# Patient Record
Sex: Male | Born: 1960 | Race: White | Hispanic: No | Marital: Married | Smoking: Never smoker
Health system: Southern US, Community
[De-identification: ages and names within clinical notes are randomized; demographics above are authoritative.]

## PROBLEM LIST (undated history)

## (undated) DIAGNOSIS — S46211A Strain of muscle, fascia and tendon of other parts of biceps, right arm, initial encounter: Secondary | ICD-10-CM

## (undated) DIAGNOSIS — K222 Esophageal obstruction: Secondary | ICD-10-CM

## (undated) DIAGNOSIS — R112 Nausea with vomiting, unspecified: Secondary | ICD-10-CM

## (undated) DIAGNOSIS — E785 Hyperlipidemia, unspecified: Secondary | ICD-10-CM

## (undated) DIAGNOSIS — Z9889 Other specified postprocedural states: Secondary | ICD-10-CM

## (undated) DIAGNOSIS — D126 Benign neoplasm of colon, unspecified: Secondary | ICD-10-CM

## (undated) DIAGNOSIS — K219 Gastro-esophageal reflux disease without esophagitis: Secondary | ICD-10-CM

## (undated) DIAGNOSIS — N200 Calculus of kidney: Secondary | ICD-10-CM

## (undated) HISTORY — PX: ROTATOR CUFF REPAIR: SHX139

## (undated) HISTORY — PX: HERNIA REPAIR: SHX51

## (undated) HISTORY — DX: Esophageal obstruction: K22.2

## (undated) HISTORY — DX: Gastro-esophageal reflux disease without esophagitis: K21.9

## (undated) HISTORY — DX: Hyperlipidemia, unspecified: E78.5

## (undated) HISTORY — DX: Calculus of kidney: N20.0

## (undated) HISTORY — DX: Benign neoplasm of colon, unspecified: D12.6

## (undated) HISTORY — PX: BICEPS TENDON REPAIR: SHX566

---

## 1998-07-14 ENCOUNTER — Ambulatory Visit (HOSPITAL_COMMUNITY): Admission: RE | Admit: 1998-07-14 | Discharge: 1998-07-14 | Payer: Self-pay | Admitting: *Deleted

## 2004-02-27 ENCOUNTER — Ambulatory Visit (HOSPITAL_COMMUNITY): Admission: RE | Admit: 2004-02-27 | Discharge: 2004-02-27 | Payer: Self-pay | Admitting: Internal Medicine

## 2004-10-20 ENCOUNTER — Encounter: Admission: RE | Admit: 2004-10-20 | Discharge: 2004-10-20 | Payer: Self-pay | Admitting: General Surgery

## 2005-09-29 ENCOUNTER — Encounter: Admission: RE | Admit: 2005-09-29 | Discharge: 2005-09-29 | Payer: Self-pay | Admitting: General Surgery

## 2007-02-26 ENCOUNTER — Ambulatory Visit: Payer: Self-pay | Admitting: Cardiology

## 2007-03-28 ENCOUNTER — Ambulatory Visit: Payer: Self-pay | Admitting: Cardiology

## 2008-09-05 DIAGNOSIS — D126 Benign neoplasm of colon, unspecified: Secondary | ICD-10-CM

## 2008-09-05 HISTORY — DX: Benign neoplasm of colon, unspecified: D12.6

## 2009-01-26 ENCOUNTER — Encounter (INDEPENDENT_AMBULATORY_CARE_PROVIDER_SITE_OTHER): Payer: Self-pay | Admitting: *Deleted

## 2009-01-27 ENCOUNTER — Encounter (INDEPENDENT_AMBULATORY_CARE_PROVIDER_SITE_OTHER): Payer: Self-pay | Admitting: *Deleted

## 2009-04-05 HISTORY — PX: ESOPHAGOGASTRODUODENOSCOPY: SHX1529

## 2009-04-05 HISTORY — PX: COLONOSCOPY: SHX174

## 2009-04-16 ENCOUNTER — Ambulatory Visit: Payer: Self-pay | Admitting: Internal Medicine

## 2009-04-17 ENCOUNTER — Encounter: Payer: Self-pay | Admitting: Internal Medicine

## 2009-04-22 ENCOUNTER — Ambulatory Visit: Payer: Self-pay | Admitting: Internal Medicine

## 2009-04-22 ENCOUNTER — Encounter: Payer: Self-pay | Admitting: Internal Medicine

## 2009-04-22 ENCOUNTER — Ambulatory Visit (HOSPITAL_COMMUNITY): Admission: RE | Admit: 2009-04-22 | Discharge: 2009-04-22 | Payer: Self-pay | Admitting: Internal Medicine

## 2009-04-24 ENCOUNTER — Encounter: Payer: Self-pay | Admitting: Internal Medicine

## 2009-04-28 ENCOUNTER — Encounter: Payer: Self-pay | Admitting: Internal Medicine

## 2010-04-05 HISTORY — PX: APPENDECTOMY: SHX54

## 2010-10-07 NOTE — Letter (Signed)
Summary: tcs report/2005/dr gessner  tcs report/2005/dr gessner   Imported By: Diana Eves 04/24/2009 15:26:09  _____________________________________________________________________  External Attachment:    Type:   Image     Comment:   External Document

## 2010-10-07 NOTE — Letter (Signed)
Summary: Recall Colonoscopy Letter  [facility]  [address] [place], [place] [address]   Phone: [phone]  Fax: [phone]      Jan 27, 2009 MRN: [id number]   [facility] [address] [place], [place]  [address]   Dear Ryan Jennings,   According to your medical record, it is time for you to schedule a Colonoscopy. The American Cancer Society recommends this procedure as a method to detect early colon cancer. Patients with a family history of colon cancer, or a personal history of colon polyps or inflammatory bowel disease are at increased risk.  This letter has beeen generated based on the recommendations made at the time of your procedure. If you feel that in your particular situation this may no longer apply, please contact our office.  Please call our office at [phone] to schedule this appointment or to update your records at your earliest convenience.  Thank you for cooperating with [place] to provide you with the very best care possible.   Sincerely,  Iva Boop, M.D.  [facility] [phone]

## 2010-10-07 NOTE — Letter (Signed)
Summary: REFERRAL/[facility]  REFERRAL/[facility]   Imported By: Diana Eves 04/17/2009 14:19:10  _____________________________________________________________________  External Attachment:    Type:   Image     Comment:   External Document

## 2010-10-07 NOTE — Letter (Signed)
Summary: Generic Letter, Intro to Referring  [facility]  [address]   [place], [place] [address]   Phone: [phone]  Fax: [phone]      Jan 26, 2009             RE: Ryan Jennings   02-08-1961                 [address]                 [place], [place]  [address]  Dear Appt Secretary,   This pt has been scheduled an appt for 04/16/2009 @ 1:30 w/ [place].  LMOM with appt info and for a return call.          Sincerely,    Elinor Parkinson  [facility] Ph: [phone]   Fax: [phone]

## 2010-10-07 NOTE — Letter (Signed)
Summary: [facility]/EGD ORDER  [facility]/EGD ORDER   Imported By: Ave Filter 04/17/2009 13:22:32  _____________________________________________________________________  External Attachment:    Type:   Image     Comment:   External Document

## 2010-10-07 NOTE — Letter (Signed)
Summary: Patient Notice, Colon Biopsy Results  [facility]  [address]   [place], [place] [address]   Phone: [phone]  Fax: [phone]       April 28, 2009   [place] [address] [place], [place]  [address] September 28, 1960    Dear Mr. Peckinpaugh,  I am pleased to inform you that the biopsies taken during your recent colonoscopy did not show any evidence of cancer upon pathologic examination.  Additional information/recommendations:  You should have a repeat colonoscopy examination  in 5 years.  Please call us if you are having persistent problems or have questions about your condition that have not been fully answered at this time.  Sincerely,    R. Roetta Sessions MD  [facility] Ph: [phone]    Fax: [phone]   Appended Document: Patient Notice, Colon Biopsy Results mailed letter to pt  Appended Document: Patient Notice, Colon Biopsy Results Reminder in computer for 5 yrs.  AS

## 2011-01-18 NOTE — Procedures (Signed)
[facility]                              EXERCISE TREADMILL   NAME:Ryan Jennings, Ryan Jennings                      MRN:          [id number]  DATE:03/28/2007                            DOB:          1961/02/19    INDICATIONS:  Evaluate patient with chest pain and cardiovascular risk  factors.   PROCEDURE NOTE:  The patient was exercised using standard Bruce  protocol.  He was able to exercise for 12 minutes 10 seconds.  This was  10 seconds into stage IV.  The test was terminated because he had  achieved his target heart rate.  He achieved 10.1 mets.  An appropriate  blood pressure response, with a maximum of 175/78.  He hit his target  heart rate, with a maximum of 171, which was 98% of predicted.  He had  no ectopy.  He had no ischemic ST-T wave changes.  He had a normal heart  rate recovery.   CONCLUSION:  Negative adequate exercise treadmill test, with an  excellent exercise tolerance.  There are no high-risk features.   PLAN:  Based on the above, no further cardiovascular testing is  suggested.  The patient is in the low-risk category for future  cardiovascular events.  I did give him a prescription for exercise and  specific instructions on this based on this study.     Rollene Rotunda, MD, [facility]  Electronically Signed    JH/MedQ  DD: 03/28/2007  DT: 03/29/2007  Job #: [id number]   cc:   Lindaann Pascal, PA

## 2011-01-18 NOTE — Op Note (Signed)
NAMEDRAY, DENTE               ACCOUNT NO.:  [id number]   MEDICAL RECORD NO.:  [id number]          PATIENT TYPE:  AMB   LOCATION:  DAY                           FACILITY:  [facility]   PHYSICIAN:  R. Roetta Sessions, M.D. DATE OF BIRTH:  [birth year]   DATE OF PROCEDURE:  04/22/2009  DATE OF DISCHARGE:                               OPERATIVE REPORT   PROCEDURE:  EGD with Elease Hashimoto dilation followed by colonoscopy and snare  polypectomy.   INDICATIONS FOR PROCEDURE:  A 50 year old gentleman with recurrent  esophageal dysphagia to solids.  He underwent dilation of Schatzki's  ring back in the '90s and has done well until recently.  He has a  positive family history of colon polyps in a first degree relative at a  young age.  He is here for screening colonoscopy as well.  Risks,  benefits, alternatives and limitations of the procedure have been  discussed with Mr. Fitterer previously and again today at the bedside.  Please see the documentation in the medical record.   PROCEDURE NOTE:  O2 saturation, blood pressure, pulse and respirations  were monitored throughout the entirety of both procedures.  Conscious  sedation for both procedures Versed 6 mg IV, Demerol 100 mg IV in  divided doses, Zofran 4 mg IV as prophylaxis against peri procedure  nausea.  Cetacaine spray for topical pharyngeal anesthesia.  Instrumentation Pentax video chip system.   FINDINGS:  EGD examination, tubular esophagus revealed circumferential  distal esophageal erosions.  He had a couple of linear erosions as well.  The longest being approximately 2 cm superimposed on a Schatzki's ring.  There was no evidence of Barrett's esophagus or neoplasm.  The EG  junction was easily traversed with a scope.  Stomach:  Gastric cavity  was emptied and insufflated well with air.  Thorough examination of  gastric mucosa including retroflexion of proximal  stomach/esophagogastric junction demonstrated a small hiatal hernia and  a  couple of tiny antral erosions otherwise gastric mucosa appeared  entirely normal.  There was no ulcer infiltrating process.  The pylorus  was patent and easily traversed.  Examination of the bulb to the second  portion revealed no abnormalities.  Therapeutic / diagnostic maneuvers  performed:  A 52 French Maloney dilator passed full insertion with ease  and a look back revealed nice rupture of the ring with minimal bleeding  and without apparent complication.  The patient tolerated the procedure  well and was prepared for colonoscopy.  Digital rectal exam revealed no  abnormalities on scout finding, prep was adequate.  Colon:  Colonic  mucosa was surveyed from the rectosigmoid junction through the left  transverse and right colon to the appendiceal office, ileocecal valve  and cecum.  These structures were well seen and photographed for the  record.  From this level the scope was slowly cautiously withdrawn.  All  previous mentioned mucosal surfaces were again seen.  The patient had a  pedunculated angry 8 mm polyp in the mid sigmoid segment which was cold  snared, recovered through the scope.  The remainder of the colonic  mucosa  appeared normal.  The scope was pulled down in the rectum where  thorough examination of the rectal mucosa including retroflex view of  the anal verge demonstrated no abnormalities.  The patient tolerated  both procedures well and was reactive after endoscopy.  Cecal withdrawal  time 7 minutes.   EGD IMPRESSION:  Distal circumferential esophageal erosions consistent  with erosive reflux esophagitis, Schatzki's ring status post dilation  described above, hiatal hernia, a couple of tiny antral erosions of  doubtful clinical significance, otherwise normal stomach, patent pylorus  and normal D1, D2.   COLONOSCOPY IMPRESSION:  Normal rectum, pedunculated sigmoid polyp  status post snare removal and remainder of the colonic mucosa appeared  normal.    RECOMMENDATIONS:  1. Begin Protonix 40 mg orally daily for gastroesophageal reflux      disease.  2. Follow up on pathology.  3. Further recommendations to follow.      Jonathon Bellows, M.D.  Electronically Signed     RMR/MEDQ  D:  04/22/2009  T:  04/22/2009  Job:  [id number]

## 2011-01-18 NOTE — Assessment & Plan Note (Signed)
[facility]                            CARDIOLOGY OFFICE NOTE   JEREMIE, GIANGRANDE                      MRN:          [id number]  DATE:02/26/2007                            DOB:          01-29-1961    REASON FOR PRESENTATION:  Evaluate patient with chest pain and  palpitations.   HISTORY OF PRESENT ILLNESS:  The patient is a very pleasant 50 year old  gentleman with no prior cardiac history.  He did have a treadmill test  about 3 years ago that was reportedly normal.  This was done in part of  a routine physical.  Recently he had some hear fluttering.  He felt this  while he was walking at lunch one day.  He felt his heart skip some  beats.  It was uncomfortable, like a feeling that he was on a  rollercoaster.  He stopped and then resumed walking and it happened  again.  He has had symptoms a few days later when he was doing some work  in the heat.  He felt very hot and got the fluttering.  However, over  the past 2 weeks he has had none of this.  He has not been walking  outside as much as he had been, though he has been his activities of  daily living and not bringing on any of these symptoms.  He did notice  some mild chest tightness.  This has been a constant discomfort when it  comes on, lasting for perhaps an entire day.  Some mild nagging  discomfort mid sternum.  It has not limited him.  He can not bring it  on.  He feels like he has some gas buildup.  He has no radiation to his  arm or to his neck. It goes away without him even thinking about it.  He  has no shortness of breath.  Denies any PND or orthopnea.  He is not  having any presyncope or syncope.   PAST MEDICAL HISTORY:  Dyslipidemia x2 years.   PAST SURGICAL HISTORY:  1. Bilateral inguinal hernia repairs.  2. Umbilical hernia repair.  3. Rotator cuff surgery.   ALLERGIES:  PENICILLIN.   MEDICATIONS:  Crestor 5 mg daily.   SOCIAL HISTORY:  The patient is a Producer, television/film/video.  He is married and  has 3 children.  He has never smoked cigarettes and does not drink  alcohol.   FAMILY HISTORY:  Noncontributory for early coronary artery disease.   REVIEW OF SYSTEMS:  As stated in the HPI and negative for all other  systems except for seasonal allergies, reflux.   PHYSICAL EXAMINATION:  The patient is well-appearing and in no distress.  Blood pressure 130/80, heart rate 53 and regular, weight 161 pounds,  body mass index 24.  HEENT:  Eyelids unremarkable, pupils equally round and reactive to  light, fundi within normal limits, oral mucosa unremarkable.  NECK:  No jugular venous distention at 45 degrees, carotid upstroke  brisk and symmetrical, no bruits, no thyromegaly.  LYMPHATICS:  No cervical, axillary, or inguinal adenopathy.  LUNGS:  Clear to  auscultation bilaterally.  BACK:  No costovertebral angle tenderness.  CHEST:  Unremarkable.  HEART:  PMI not displaced or sustained, S1 and S2 within normal limits,  no S3, no S4, no clicks, no rubs, no murmurs.  ABDOMEN:  Flat, positive bowel sounds normal in frequency and pitch, no  bruits, no rebound, no guarding, no midline pulsatile mass, no  hepatomegaly, no splenomegaly.  SKIN:  No rashes, no nodules.  EXTREMITIES:  With 2+ pulses throughout, no edema, no cyanosis, no  clubbing.  NEURO:  Oriented to person, place, and time; cranial nerves II-XII  grossly intact, motor grossly intact.   EKG sinus rhythm, rate 60, axis within normal limits, intervals within  normal limits, no acute ST-T wave changes.   ASSESSMENT/PLAN:  1. Palpitations.  The patient was having palpitations but these ended      about 2 weeks ago.  They were either premature atrial contractions      or premature ventricular contractions.  I would not suspect any      significant pathology related to these, therefore, no further      cardiovascular testing is suggested.  If they recur he should avoid      caffeine as a first line and  let me know.  2. Chest discomfort.  The patient's chest discomfort is somewhat      atypical.  He does have dyslipidemia.  He is not exercising as much      as I would hope.  I think the pre-test probability of obstructive      coronary artery disease is low.  However, I would like to do an      exercise treadmill test to rule our obstructive disease, risk      stratifying.  Gave him a prescription for exercise.  3. Followup.  I will see him at the time of his stress test.     Rollene Rotunda, MD, [facility]  Electronically Signed    JH/MedQ  DD: 02/26/2007  DT: 02/26/2007  Job #: [id number]

## 2011-03-28 ENCOUNTER — Other Ambulatory Visit: Payer: Self-pay

## 2011-03-29 MED ORDER — PANTOPRAZOLE SODIUM 40 MG PO TBEC: 40.0000 mg | DELAYED_RELEASE_TABLET | Freq: Every day | ORAL | Status: DC

## 2011-03-29 NOTE — Telephone Encounter (Signed)
Called patient and told him he would need an office visit before he could get his next refill. He said that was fine and would call to make an appointments.

## 2011-03-29 NOTE — Telephone Encounter (Signed)
Patient needs OV prior to further refills.

## 2011-04-06 ENCOUNTER — Other Ambulatory Visit: Payer: Self-pay

## 2011-04-07 ENCOUNTER — Encounter: Payer: Self-pay | Admitting: Internal Medicine

## 2011-04-07 MED ORDER — PANTOPRAZOLE SODIUM 40 MG PO TBEC: 40.0000 mg | DELAYED_RELEASE_TABLET | Freq: Every day | ORAL | Status: DC

## 2011-04-07 NOTE — Telephone Encounter (Signed)
Pt needs OV prior to any further RFs (I gave 2)

## 2011-04-07 NOTE — Telephone Encounter (Signed)
LMOM for pt to call. 

## 2011-04-07 NOTE — Telephone Encounter (Signed)
Pt is aware of OV for 8/31 @ 1030 w/LSL

## 2011-05-06 ENCOUNTER — Encounter: Payer: Self-pay | Admitting: Gastroenterology

## 2011-05-06 ENCOUNTER — Ambulatory Visit (INDEPENDENT_AMBULATORY_CARE_PROVIDER_SITE_OTHER): Payer: 59 | Admitting: Gastroenterology

## 2011-05-06 VITALS — BP 135/72 | HR 67 | Temp 98.0°F | Ht 68.0 in | Wt 163.4 lb

## 2011-05-06 DIAGNOSIS — K219 Gastro-esophageal reflux disease without esophagitis: Secondary | ICD-10-CM | POA: Insufficient documentation

## 2011-05-06 DIAGNOSIS — R1310 Dysphagia, unspecified: Secondary | ICD-10-CM | POA: Insufficient documentation

## 2011-05-06 DIAGNOSIS — R1319 Other dysphagia: Secondary | ICD-10-CM

## 2011-05-06 DIAGNOSIS — R1314 Dysphagia, pharyngoesophageal phase: Secondary | ICD-10-CM

## 2011-05-06 NOTE — Progress Notes (Signed)
Primary Care Physician:  Monica Becton, MD  Primary Gastroenterologist:  Roetta Sessions, MD   Chief Complaint  Patient presents with  . Medication Refill    difficulty swallowing again    HPI:  Ryan Jennings is a 50 y.o. male here for followup of GERD. He complains of recurrent esophageal solid food dysphagia. At times food becomes lodged and sometimes it comes back up. Typical heartburn well controlled on pantoprazole. No vomiting, weight loss, abdominal pain, constipation, diarrhea, melena, rectal bleeding.  Current Outpatient Prescriptions  Medication Sig Dispense Refill  . fish oil-omega-3 fatty acids 1000 MG capsule Take 2 g by mouth daily.        . pantoprazole (PROTONIX) 40 MG tablet Take 1 tablet (40 mg total) by mouth daily.  31 tablet  1  . Red Yeast Rice 600 MG TABS Take by mouth.          Allergies as of 05/06/2011 - Review Complete 05/06/2011  Allergen Reaction Noted  . Penicillins Rash     Past Medical History  Diagnosis Date  . GERD (gastroesophageal reflux disease)   . Nephrolithiasis   . Esophageal ring     s/p dilation twice, last time 2010  . Adenomatous colon polyp 2010  . Hyperlipidemia     Past Surgical History  Procedure Date  . Appendectomy 8/11  . Rotator cuff repair     right  . Hernia repair     x 3  . Biceps tendon repair   . Esophagogastroduodenoscopy 04/2009    distal erosive reflux esophagitis, schatzki ring s/p 56F, hh, couple of antral erosions  . Colonoscopy 04/2009    sigmoid adenomatous polyp, next colonoscopy 04/2014    Family History  Problem Relation Age of Onset  . Lymphoma Mother 12    deceased  . Colon polyps Father     before age of 66  . Stroke Paternal Grandfather 90  . Pancreatic cancer Maternal Grandfather     History   Social History  . Marital Status: Married    Spouse Name: N/A    Number of Children: 3  . Years of Education: N/A   Occupational History  .  [employer]   Social History Main  Topics  . Smoking status: Never Smoker   . Smokeless tobacco: Not on file  . Alcohol Use: No  . Drug Use: No  . Sexually Active: Not on file   Other Topics Concern  . Not on file   Social History Narrative  . No narrative on file      ROS:  General: Negative for anorexia, weight loss, fever, chills, fatigue, weakness. Eyes: Negative for vision changes.  ENT: Negative for hoarseness, nasal congestion. CV: Negative for chest pain, angina, palpitations, dyspnea on exertion, peripheral edema.  Respiratory: Negative for dyspnea at rest, dyspnea on exertion, cough, sputum, wheezing.  GI: See history of present illness. GU:  Negative for dysuria, hematuria, urinary incontinence, urinary frequency, nocturnal urination.  MS: Negative for joint pain, low back pain.  Derm: Negative for rash or itching.  Neuro: Negative for weakness, abnormal sensation, seizure, frequent headaches, memory loss, confusion.  Psych: Negative for anxiety, depression, suicidal ideation, hallucinations.  Endo: Negative for unusual weight change.  Heme: Negative for bruising or bleeding. Allergy: Negative for rash or hives.    Physical Examination:  BP 135/72  Pulse 67  Temp(Src) 98 F (36.7 C) (Temporal)  Ht 5\' 8"  (1.727 m)  Wt 163 lb 6.4 oz (74.118 kg)  BMI 24.84 kg/m2   General: Well-nourished, well-developed in no acute distress.  Head: Normocephalic, atraumatic.   Eyes: Conjunctiva pink, no icterus. Mouth: Oropharyngeal mucosa moist and pink , no lesions erythema or exudate. Neck: Supple without thyromegaly, masses, or lymphadenopathy.  Lungs: Clear to auscultation bilaterally.  Heart: Regular rate and rhythm, no murmurs rubs or gallops.  Abdomen: Bowel sounds are normal, nontender, nondistended, no hepatosplenomegaly or masses, no abdominal bruits or    hernia , no rebound or guarding.   Rectal: Not performed. Extremities: No lower extremity edema. No clubbing or deformities.  Neuro: Alert and  oriented x 4 , grossly normal neurologically.  Skin: Warm and dry, no rash or jaundice.   Psych: Alert and cooperative, normal mood and affect.

## 2011-05-06 NOTE — Assessment & Plan Note (Signed)
Probable recurrent Schatzki ring. EGD/ED in near future. Patient received Zofran prior to last EGD to prevent periprocedure nausea.  I have discussed the risks, alternatives, benefits with regards to but not limited to the risk of reaction to medication, bleeding, infection, perforation and the patient is agreeable to proceed. Written consent to be obtained.

## 2011-05-06 NOTE — Assessment & Plan Note (Signed)
Heartburn well-controlled. Continue PPI.

## 2011-05-07 HISTORY — PX: ESOPHAGOGASTRODUODENOSCOPY: SHX1529

## 2011-05-10 NOTE — Progress Notes (Signed)
Cc to PCP 

## 2011-05-20 ENCOUNTER — Encounter (HOSPITAL_COMMUNITY)
Admission: RE | Disposition: A | Discharge status: Home / Self Care | Payer: Self-pay | Source: Ambulatory Visit | Attending: Internal Medicine

## 2011-05-20 ENCOUNTER — Encounter (HOSPITAL_COMMUNITY): Payer: Self-pay | Admitting: *Deleted

## 2011-05-20 ENCOUNTER — Ambulatory Visit (HOSPITAL_COMMUNITY)
Admission: RE | Admit: 2011-05-20 | Discharge: 2011-05-20 | Disposition: A | Discharge status: Home / Self Care | Payer: 59 | Source: Ambulatory Visit | Attending: Internal Medicine | Admitting: Internal Medicine

## 2011-05-20 DIAGNOSIS — R1310 Dysphagia, unspecified: Secondary | ICD-10-CM

## 2011-05-20 DIAGNOSIS — R1319 Other dysphagia: Secondary | ICD-10-CM

## 2011-05-20 DIAGNOSIS — K2100 Gastro-esophageal reflux disease with esophagitis, without bleeding: Secondary | ICD-10-CM | POA: Insufficient documentation

## 2011-05-20 DIAGNOSIS — K222 Esophageal obstruction: Secondary | ICD-10-CM | POA: Insufficient documentation

## 2011-05-20 DIAGNOSIS — K219 Gastro-esophageal reflux disease without esophagitis: Secondary | ICD-10-CM

## 2011-05-20 DIAGNOSIS — E785 Hyperlipidemia, unspecified: Secondary | ICD-10-CM | POA: Insufficient documentation

## 2011-05-20 SURGERY — ESOPHAGOGASTRODUODENOSCOPY (EGD) WITH ESOPHAGEAL DILATION
Anesthesia: Moderate Sedation

## 2011-05-20 MED ORDER — BUTAMBEN-TETRACAINE-BENZOCAINE 2-2-14 % EX AERO
INHALATION_SPRAY | CUTANEOUS | Status: DC | PRN
  Administered 2011-05-20: 2 via TOPICAL

## 2011-05-20 MED ORDER — ONDANSETRON HCL 4 MG/2ML IJ SOLN
INTRAMUSCULAR | Status: DC | PRN
  Administered 2011-05-20: 4 mg via INTRAVENOUS

## 2011-05-20 MED ORDER — MEPERIDINE HCL 100 MG/ML IJ SOLN
INTRAMUSCULAR | Status: AC
  Filled 2011-05-20: qty 2

## 2011-05-20 MED ORDER — MIDAZOLAM HCL 5 MG/5ML IJ SOLN
INTRAMUSCULAR | Status: DC | PRN
  Administered 2011-05-20 (×2): 2 mg via INTRAVENOUS

## 2011-05-20 MED ORDER — MEPERIDINE HCL 100 MG/ML IJ SOLN
INTRAMUSCULAR | Status: DC | PRN
  Administered 2011-05-20: 50 mg
  Administered 2011-05-20: 25 mg

## 2011-05-20 MED ORDER — MIDAZOLAM HCL 5 MG/5ML IJ SOLN
INTRAMUSCULAR | Status: AC
  Filled 2011-05-20: qty 10

## 2011-05-20 MED ORDER — SODIUM CHLORIDE 0.45 % IV SOLN
Freq: Once | INTRAVENOUS | Status: AC
  Administered 2011-05-20: 08:00:00 via INTRAVENOUS

## 2011-05-20 MED ORDER — ONDANSETRON HCL 4 MG/2ML IJ SOLN
INTRAMUSCULAR | Status: AC
  Filled 2011-05-20: qty 2

## 2011-05-20 NOTE — H&P (Signed)
Tana Coast, PA  05/06/2011 11:02 AM  Signed Primary Care Physician:  Monica Becton, MD   Primary Gastroenterologist:  Roetta Sessions, MD      Chief Complaint   Patient presents with   .  Medication Refill       difficulty swallowing again      HPI:  Ryan Jennings is a 50 y.o. male here for followup of GERD. He complains of recurrent esophageal solid food dysphagia. At times food becomes lodged and sometimes it comes back up. Typical heartburn well controlled on pantoprazole. No vomiting, weight loss, abdominal pain, constipation, diarrhea, melena, rectal bleeding.    Current Outpatient Prescriptions   Medication  Sig  Dispense  Refill   .  fish oil-omega-3 fatty acids 1000 MG capsule  Take 2 g by mouth daily.           .  pantoprazole (PROTONIX) 40 MG tablet  Take 1 tablet (40 mg total) by mouth daily.   31 tablet   1   .  Red Yeast Rice 600 MG TABS  Take by mouth.               Allergies as of 05/06/2011 - Review Complete 05/06/2011   Allergen  Reaction  Noted   .  Penicillins  Rash         Past Medical History   Diagnosis  Date   .  GERD (gastroesophageal reflux disease)     .  Nephrolithiasis     .  Esophageal ring         s/p dilation twice, last time 2010   .  Adenomatous colon polyp  2010   .  Hyperlipidemia         Past Surgical History   Procedure  Date   .  Appendectomy  8/11   .  Rotator cuff repair         right   .  Hernia repair         x 3   .  Biceps tendon repair     .  Esophagogastroduodenoscopy  04/2009       distal erosive reflux esophagitis, schatzki ring s/p 72F, hh, couple of antral erosions   .  Colonoscopy  04/2009       sigmoid adenomatous polyp, next colonoscopy 04/2014       Family History   Problem  Relation  Age of Onset   .  Lymphoma  Mother  42       deceased   .  Colon polyps  Father         before age of 38   .  Stroke  Paternal Grandfather  65   .  Pancreatic cancer  Maternal Grandfather         History         Social History   .  Marital Status:  Married       Spouse Name:  N/A       Number of Children:  3   .  Years of Education:  N/A       Occupational History   .    [employer]       Social History Main Topics   .  Smoking status:  Never Smoker    .  Smokeless tobacco:  Not on file   .  Alcohol Use:  No   .  Drug Use:  No   .  Sexually Active:  Not  on file       Other Topics  Concern   .  Not on file       Social History Narrative   .  No narrative on file        ROS:   General: Negative for anorexia, weight loss, fever, chills, fatigue, weakness. Eyes: Negative for vision changes.   ENT: Negative for hoarseness, nasal congestion. CV: Negative for chest pain, angina, palpitations, dyspnea on exertion, peripheral edema.   Respiratory: Negative for dyspnea at rest, dyspnea on exertion, cough, sputum, wheezing.   GI: See history of present illness. GU:  Negative for dysuria, hematuria, urinary incontinence, urinary frequency, nocturnal urination.   MS: Negative for joint pain, low back pain.   Derm: Negative for rash or itching.   Neuro: Negative for weakness, abnormal sensation, seizure, frequent headaches, memory loss, confusion.   Psych: Negative for anxiety, depression, suicidal ideation, hallucinations.   Endo: Negative for unusual weight change.   Heme: Negative for bruising or bleeding. Allergy: Negative for rash or hives.     Physical Examination:   BP 135/72  Pulse 67  Temp(Src) 98 F (36.7 C) (Temporal)  Ht 5\' 8"  (1.727 m)  Wt 163 lb 6.4 oz (74.118 kg)  BMI 24.84 kg/m2    General: Well-nourished, well-developed in no acute distress.   Head: Normocephalic, atraumatic.    Eyes: Conjunctiva pink, no icterus. Mouth: Oropharyngeal mucosa moist and pink , no lesions erythema or exudate. Neck: Supple without thyromegaly, masses, or lymphadenopathy.   Lungs: Clear to auscultation bilaterally.   Heart: Regular rate and rhythm, no murmurs rubs or  gallops.   Abdomen: Bowel sounds are normal, nontender, nondistended, no hepatosplenomegaly or masses, no abdominal bruits or    hernia , no rebound or guarding.    Rectal: Not performed. Extremities: No lower extremity edema. No clubbing or deformities.   Neuro: Alert and oriented x 4 , grossly normal neurologically.   Skin: Warm and dry, no rash or jaundice.    Psych: Alert and cooperative, normal mood and affect.         Glendora Score  05/10/2011 12:18 PM  Signed Cc to PCP        GERD (gastroesophageal reflux disease) - Tana Coast, PA  05/06/2011 11:01 AM  Signed Heartburn well-controlled. Continue PPI.  Esophageal dysphagia - Tana Coast, PA  05/06/2011 11:02 AM  Signed Probable recurrent Schatzki ring. EGD/ED in near future. Patient received Zofran prior to last EGD to prevent periprocedure nausea.  I have discussed the risks, alternatives, benefits with regards to but not limited to the risk of reaction to medication, bleeding, infection, perforation and the patient is agreeable to proceed. Written consent to be obtained.      I have seen the patient prior to the procedure(s) today and reviewed the history and physical / consultation from 05/06/11.  There have been no changes. After consideration of the risks, benefits, alternatives and imponderables, the patient has consented to the procedure(s).

## 2011-05-23 DIAGNOSIS — K2100 Gastro-esophageal reflux disease with esophagitis, without bleeding: Secondary | ICD-10-CM

## 2011-05-23 DIAGNOSIS — K222 Esophageal obstruction: Secondary | ICD-10-CM

## 2011-05-23 DIAGNOSIS — R1310 Dysphagia, unspecified: Secondary | ICD-10-CM

## 2011-06-21 ENCOUNTER — Other Ambulatory Visit: Payer: Self-pay | Admitting: Family Medicine

## 2011-06-21 ENCOUNTER — Ambulatory Visit (HOSPITAL_COMMUNITY)
Admission: RE | Admit: 2011-06-21 | Discharge: 2011-06-21 | Disposition: A | Discharge status: Home / Self Care | Payer: 59 | Source: Ambulatory Visit | Attending: Family Medicine | Admitting: Family Medicine

## 2011-06-21 DIAGNOSIS — R1032 Left lower quadrant pain: Secondary | ICD-10-CM | POA: Insufficient documentation

## 2011-06-21 DIAGNOSIS — R52 Pain, unspecified: Secondary | ICD-10-CM

## 2011-06-21 DIAGNOSIS — N200 Calculus of kidney: Secondary | ICD-10-CM | POA: Insufficient documentation

## 2012-05-28 ENCOUNTER — Other Ambulatory Visit: Payer: Self-pay

## 2012-05-28 MED ORDER — DEXLANSOPRAZOLE 60 MG PO CPDR: 60.0000 mg | DELAYED_RELEASE_CAPSULE | Freq: Every day | ORAL | Status: DC

## 2012-12-17 ENCOUNTER — Other Ambulatory Visit: Payer: Self-pay

## 2012-12-17 MED ORDER — DEXLANSOPRAZOLE 60 MG PO CPDR: 60.0000 mg | DELAYED_RELEASE_CAPSULE | Freq: Every day | ORAL | Status: DC

## 2013-10-17 ENCOUNTER — Other Ambulatory Visit: Payer: Self-pay | Admitting: Nurse Practitioner

## 2013-10-17 MED ORDER — OSELTAMIVIR PHOSPHATE 75 MG PO CAPS: 75.0000 mg | ORAL_CAPSULE | Freq: Every day | ORAL | Status: DC

## 2014-01-31 ENCOUNTER — Other Ambulatory Visit: Payer: Self-pay | Admitting: Gastroenterology

## 2014-02-04 ENCOUNTER — Encounter: Payer: Self-pay | Admitting: Internal Medicine

## 2014-02-04 NOTE — Telephone Encounter (Signed)
Mailed letter for patient to call our office to set up OV to further refills

## 2014-02-04 NOTE — Telephone Encounter (Signed)
One refill. Needs OV because last seen in 2012.

## 2014-02-28 ENCOUNTER — Other Ambulatory Visit: Payer: Self-pay | Admitting: Gastroenterology

## 2014-03-17 ENCOUNTER — Encounter (INDEPENDENT_AMBULATORY_CARE_PROVIDER_SITE_OTHER): Payer: Self-pay

## 2014-03-17 ENCOUNTER — Ambulatory Visit (INDEPENDENT_AMBULATORY_CARE_PROVIDER_SITE_OTHER): Payer: 59 | Admitting: Gastroenterology

## 2014-03-17 ENCOUNTER — Other Ambulatory Visit: Payer: Self-pay | Admitting: Internal Medicine

## 2014-03-17 ENCOUNTER — Encounter: Payer: Self-pay | Admitting: Gastroenterology

## 2014-03-17 VITALS — BP 138/72 | HR 67 | Temp 97.8°F | Resp 20 | Ht 68.0 in | Wt 174.0 lb

## 2014-03-17 DIAGNOSIS — D126 Benign neoplasm of colon, unspecified: Secondary | ICD-10-CM | POA: Insufficient documentation

## 2014-03-17 DIAGNOSIS — K2100 Gastro-esophageal reflux disease with esophagitis, without bleeding: Secondary | ICD-10-CM

## 2014-03-17 DIAGNOSIS — D369 Benign neoplasm, unspecified site: Secondary | ICD-10-CM

## 2014-03-17 MED ORDER — DEXLANSOPRAZOLE 60 MG PO CPDR: 60.0000 mg | DELAYED_RELEASE_CAPSULE | Freq: Every day | ORAL | Status: DC

## 2014-03-17 MED ORDER — PEG 3350-KCL-NA BICARB-NACL 420 G PO SOLR: 4000.0000 mL | ORAL | Status: DC

## 2014-03-17 NOTE — Assessment & Plan Note (Signed)
Due for surveillance now. Last in 2010. No lower GI symptoms of concern, no FH of colorectal cancer.  Proceed with TCS with Dr. [name] in near future: the risks, benefits, and alternatives have been discussed with the patient in detail. The patient states understanding and desires to proceed.

## 2014-03-17 NOTE — Progress Notes (Signed)
Referring Provider: Chipper Herb, MD Primary Care Physician:  Haywood Pao, MD Primary GI: Dr. [name]   Chief Complaint  Patient presents with  . Medication Refill    HPI:   Ryan Jennings presents today in routine follow-up with a history of GERD. Last seen in 2012 and underwent EGD due to dysphagia with findings of erosive reflux esophagitis, Schatzki's ring and early stricture s/p dilation. No GERD exacerbations. Dexilant works well. No dysphagia. No abdominal pain. Last colonoscopy in 2010 with adenomatous polyp. Due for surveillance now. No lower GI complaints.   Past Medical History  Diagnosis Date  . GERD (gastroesophageal reflux disease)   . Nephrolithiasis   . Esophageal ring     2010, 2012  . Adenomatous colon polyp 2010  . Hyperlipidemia     Past Surgical History  Procedure Laterality Date  . Appendectomy  8/11  . Rotator cuff repair      right  . Hernia repair      x 3  . Biceps tendon repair    . Esophagogastroduodenoscopy  04/2009    distal erosive reflux esophagitis, schatzki ring s/p 67F, hh, couple of antral erosions  . Colonoscopy  04/2009    sigmoid adenomatous polyp, next colonoscopy 04/2014  . Esophagogastroduodenoscopy  05/2011    Dr. [name]: erosive reflux esophagitis, superimposed on Schatzki's ring and early stricture s/p 16 F, small hiatal hernia    Current Outpatient Prescriptions  Medication Sig Dispense Refill  . atorvastatin (LIPITOR) 20 MG tablet Take 20 mg by mouth daily.      Marland Kitchen dexlansoprazole (DEXILANT) 60 MG capsule Take 1 capsule (60 mg total) by mouth daily.  30 capsule  11  . meloxicam (MOBIC) 7.5 MG tablet Take 7.5 mg by mouth as needed for pain.      . polyethylene glycol-electrolytes (TRILYTE) 420 G solution Take 4,000 mLs by mouth as directed.  4000 mL  0   No current facility-administered medications for this visit.    Allergies as of 03/17/2014 - Review Complete 03/17/2014  Allergen Reaction Noted  .  Penicillins Rash     Family History  Problem Relation Age of Onset  . Lymphoma Mother 56    deceased  . Colon polyps Father     before age of 24  . Stroke Paternal Grandfather 48  . Pancreatic cancer Maternal Grandfather   . Colon cancer Neg Hx     History   Social History  . Marital Status: Married    Spouse Name: N/A    Number of Children: 3  . Years of Education: N/A   Occupational History  .  [employer]   Social History Main Topics  . Smoking status: Never Smoker   . Smokeless tobacco: None  . Alcohol Use: No  . Drug Use: No  . Sexual Activity: None   Other Topics Concern  . None   Social History Narrative  . None    Review of Systems: As mentioned in HPI.   Physical Exam: BP 138/72  Pulse 67  Temp(Src) 97.8 F (36.6 C) (Oral)  Resp 20  Ht 5\' 8"  (1.727 m)  Wt 174 lb (78.926 kg)  BMI 26.46 kg/m2 General:   Alert and oriented. No distress noted. Pleasant and cooperative.  Head:  Normocephalic and atraumatic. Eyes:  Conjuctiva clear without scleral icterus. Mouth:  Oral mucosa pink and moist. Good dentition. No lesions. Heart:  S1, S2 present without murmurs, rubs, or gallops. Regular rate  and rhythm. Abdomen:  +BS, soft, non-tender and non-distended. No rebound or guarding. No HSM or masses noted. Msk:  Symmetrical without gross deformities. Normal posture. Extremities:  Without edema. Neurologic:  Alert and  oriented x4;  grossly normal neurologically. Skin:  Intact without significant lesions or rashes. Psych:  Alert and cooperative. Normal mood and affect.

## 2014-03-17 NOTE — Assessment & Plan Note (Signed)
Doing well on Dexilant daily. Refills provided.

## 2014-03-17 NOTE — Patient Instructions (Signed)
We have scheduled you for a routine surveillance colonoscopy due to your history of colon polyps.  Continue Dexilant once daily. I sent refills to your pharmacy.

## 2014-03-18 NOTE — Progress Notes (Signed)
cc'd to pcp 

## 2014-03-24 ENCOUNTER — Encounter (HOSPITAL_COMMUNITY): Payer: Self-pay | Admitting: *Deleted

## 2014-03-24 ENCOUNTER — Ambulatory Visit (HOSPITAL_COMMUNITY)
Admission: RE | Admit: 2014-03-24 | Discharge: 2014-03-24 | Disposition: A | Discharge status: Home / Self Care | Payer: 59 | Source: Ambulatory Visit | Attending: Internal Medicine | Admitting: Internal Medicine

## 2014-03-24 ENCOUNTER — Encounter (HOSPITAL_COMMUNITY)
Admission: RE | Disposition: A | Discharge status: Home / Self Care | Payer: Self-pay | Source: Ambulatory Visit | Attending: Internal Medicine

## 2014-03-24 DIAGNOSIS — Z86010 Personal history of colonic polyps: Secondary | ICD-10-CM

## 2014-03-24 DIAGNOSIS — Z83719 Family history of colon polyps, unspecified: Secondary | ICD-10-CM | POA: Insufficient documentation

## 2014-03-24 DIAGNOSIS — K2100 Gastro-esophageal reflux disease with esophagitis, without bleeding: Secondary | ICD-10-CM

## 2014-03-24 DIAGNOSIS — D369 Benign neoplasm, unspecified site: Secondary | ICD-10-CM

## 2014-03-24 DIAGNOSIS — E785 Hyperlipidemia, unspecified: Secondary | ICD-10-CM | POA: Insufficient documentation

## 2014-03-24 DIAGNOSIS — K219 Gastro-esophageal reflux disease without esophagitis: Secondary | ICD-10-CM | POA: Insufficient documentation

## 2014-03-24 DIAGNOSIS — Z79899 Other long term (current) drug therapy: Secondary | ICD-10-CM | POA: Insufficient documentation

## 2014-03-24 DIAGNOSIS — Z09 Encounter for follow-up examination after completed treatment for conditions other than malignant neoplasm: Secondary | ICD-10-CM | POA: Insufficient documentation

## 2014-03-24 DIAGNOSIS — K639 Disease of intestine, unspecified: Secondary | ICD-10-CM

## 2014-03-24 DIAGNOSIS — Z8371 Family history of colonic polyps: Secondary | ICD-10-CM | POA: Insufficient documentation

## 2014-03-24 DIAGNOSIS — D126 Benign neoplasm of colon, unspecified: Secondary | ICD-10-CM | POA: Insufficient documentation

## 2014-03-24 HISTORY — DX: Other specified postprocedural states: Z98.890

## 2014-03-24 HISTORY — PX: COLONOSCOPY: SHX5424

## 2014-03-24 HISTORY — DX: Other specified postprocedural states: R11.2

## 2014-03-24 SURGERY — COLONOSCOPY
Anesthesia: Moderate Sedation

## 2014-03-24 MED ORDER — MEPERIDINE HCL 100 MG/ML IJ SOLN
INTRAMUSCULAR | Status: AC
  Filled 2014-03-24: qty 2

## 2014-03-24 MED ORDER — MEPERIDINE HCL 100 MG/ML IJ SOLN
INTRAMUSCULAR | Status: DC | PRN
  Administered 2014-03-24 (×2): 50 mg via INTRAVENOUS

## 2014-03-24 MED ORDER — STERILE WATER FOR IRRIGATION IR SOLN
Status: DC | PRN
  Administered 2014-03-24: 09:00:00

## 2014-03-24 MED ORDER — MIDAZOLAM HCL 5 MG/5ML IJ SOLN
INTRAMUSCULAR | Status: DC | PRN
  Administered 2014-03-24 (×2): 2 mg via INTRAVENOUS
  Administered 2014-03-24: 1 mg via INTRAVENOUS

## 2014-03-24 MED ORDER — MIDAZOLAM HCL 5 MG/5ML IJ SOLN
INTRAMUSCULAR | Status: AC
  Filled 2014-03-24: qty 10

## 2014-03-24 MED ORDER — SODIUM CHLORIDE 0.9 % IV SOLN
INTRAVENOUS | Status: DC
  Administered 2014-03-24: 1000 mL via INTRAVENOUS

## 2014-03-24 MED ORDER — ONDANSETRON HCL 4 MG/2ML IJ SOLN
INTRAMUSCULAR | Status: AC
  Filled 2014-03-24: qty 2

## 2014-03-24 MED ORDER — ONDANSETRON HCL 4 MG/2ML IJ SOLN
INTRAMUSCULAR | Status: DC | PRN
  Administered 2014-03-24: 4 mg via INTRAVENOUS

## 2014-03-24 NOTE — Discharge Instructions (Addendum)
°Colonoscopy °Discharge Instructions ° °Read the instructions outlined below and refer to this sheet in the next few weeks. These discharge instructions provide you with general information on caring for yourself after you leave the hospital. Your doctor may also give you specific instructions. While your treatment has been planned according to the most current medical practices available, unavoidable complications occasionally occur. If you have any problems or questions after discharge, call Dr. Rourk at [phone]. °ACTIVITY °· You may resume your regular activity, but move at a slower pace for the next 24 hours.  °· Take frequent rest periods for the next 24 hours.  °· Walking will help get rid of the air and reduce the bloated feeling in your belly (abdomen).  °· No driving for 24 hours (because of the medicine (anesthesia) used during the test).   °· Do not sign any important legal documents or operate any machinery for 24 hours (because of the anesthesia used during the test).  °NUTRITION °· Drink plenty of fluids.  °· You may resume your normal diet as instructed by your doctor.  °· Begin with a light meal and progress to your normal diet. Heavy or fried foods are harder to digest and may make you feel sick to your stomach (nauseated).  °· Avoid alcoholic beverages for 24 hours or as instructed.  °MEDICATIONS °· You may resume your normal medications unless your doctor tells you otherwise.  °WHAT YOU CAN EXPECT TODAY °· Some feelings of bloating in the abdomen.  °· Passage of more gas than usual.  °· Spotting of blood in your stool or on the toilet paper.  °IF YOU HAD POLYPS REMOVED DURING THE COLONOSCOPY: °· No aspirin products for 7 days or as instructed.  °· No alcohol for 7 days or as instructed.  °· Eat a soft diet for the next 24 hours.  °FINDING OUT THE RESULTS OF YOUR TEST °Not all test results are available during your visit. If your test results are not back during the visit, make an appointment  with your caregiver to find out the results. Do not assume everything is normal if you have not heard from your caregiver or the medical facility. It is important for you to follow up on all of your test results.  °SEEK IMMEDIATE MEDICAL ATTENTION IF: °· You have more than a spotting of blood in your stool.  °· Your belly is swollen (abdominal distention).  °· You are nauseated or vomiting.  °· You have a temperature over 101.  °· You have abdominal pain or discomfort that is severe or gets worse throughout the day.  ° ° °Polyp information provided ° °Further recommendations to follow pending review of pathology report ° °Colon Polyps °Polyps are lumps of extra tissue growing inside the body. Polyps can grow in the large intestine (colon). Most colon polyps are noncancerous (benign). However, some colon polyps can become cancerous over time. Polyps that are larger than a pea may be harmful. To be safe, caregivers remove and test all polyps. °CAUSES  °Polyps form when mutations in the genes cause your cells to grow and divide even though no more tissue is needed. °RISK FACTORS °There are a number of risk factors that can increase your chances of getting colon polyps. They include: °· Being older than 50 years. °· Family history of colon polyps or colon cancer. °· Long-term colon diseases, such as colitis or Crohn disease. °· Being overweight. °· Smoking. °· Being inactive. °· Drinking too much alcohol. °SYMPTOMS  °  Most small polyps do not cause symptoms. If symptoms are present, they may include: °· Blood in the stool. The stool may look dark red or black. °· Constipation or diarrhea that lasts longer than 1 week. °DIAGNOSIS °People often do not know they have polyps until their caregiver finds them during a regular checkup. Your caregiver can use 4 tests to check for polyps: °· Digital rectal exam. The caregiver wears gloves and feels inside the rectum. This test would find polyps only in the rectum. °· Barium  enema. The caregiver puts a liquid called barium into your rectum before taking X-rays of your colon. Barium makes your colon look white. Polyps are dark, so they are easy to see in the X-ray pictures. °· Sigmoidoscopy. A thin, flexible tube (sigmoidoscope) is placed into your rectum. The sigmoidoscope has a light and tiny camera in it. The caregiver uses the sigmoidoscope to look at the last third of your colon. °· Colonoscopy. This test is like sigmoidoscopy, but the caregiver looks at the entire colon. This is the most common method for finding and removing polyps. °TREATMENT  °Any polyps will be removed during a sigmoidoscopy or colonoscopy. The polyps are then tested for cancer. °PREVENTION  °To help lower your risk of getting more colon polyps: °· Eat plenty of fruits and vegetables. Avoid eating fatty foods. °· Do not smoke. °· Avoid drinking alcohol. °· Exercise every day. °· Lose weight if recommended by your caregiver. °· Eat plenty of calcium and folate. Foods that are rich in calcium include milk, cheese, and broccoli. Foods that are rich in folate include chickpeas, kidney beans, and spinach. °HOME CARE INSTRUCTIONS °Keep all follow-up appointments as directed by your caregiver. You may need periodic exams to check for polyps. °SEEK MEDICAL CARE IF: °You notice bleeding during a bowel movement. °Document Released: 05/18/2004 Document Revised: 11/14/2011 Document Reviewed: 11/01/2011 °ExitCare® Patient Information ©2015 [employer]. This information is not intended to replace advice given to you by your health care provider. Make sure you discuss any questions you have with your health care provider. ° °

## 2014-03-24 NOTE — Interval H&P Note (Signed)
History and Physical Interval Note:  03/24/2014 8:47 AM  Ryan Jennings  has presented today for surgery, with the diagnosis of ADENOMATOUS POLYPS GERD  The various methods of treatment have been discussed with the patient and family. After consideration of risks, benefits and other options for treatment, the patient has consented to  Procedure(s) with comments: COLONOSCOPY (N/A) - 1:45-moved to [place] notified pt as a surgical intervention .  The patient's history has been reviewed, patient examined, no change in status, stable for surgery.  I have reviewed the patient's chart and labs.  Questions were answered to the patient's satisfaction.     No change. Colonoscopy per plan.The risks, benefits, limitations, alternatives and imponderables have been reviewed with the patient. Questions have been answered. All parties are agreeable.   Manus Rudd

## 2014-03-24 NOTE — Op Note (Signed)
[facility] [address] [place], [address]   COLONOSCOPY PROCEDURE REPORT  PATIENT: Ryan, Jennings  MR#:         [id number] BIRTHDATE: 03-17-1961 , 10  yrs. old GENDER: Male ENDOSCOPIST: R.  Garfield Cornea, MD FACP [facility] REFERRED BY:     Dr. Rosana Hoes PROCEDURE DATE:  03/24/2014 PROCEDURE:     Ileocolonoscopy with biopsy, ablation and snare polypectomy  INDICATIONS: Surveillance examination; history of colonic adenoma  INFORMED CONSENT:  The risks, benefits, alternatives and imponderables including but not limited to bleeding, perforation as well as the possibility of a missed lesion have been reviewed.  The potential for biopsy, lesion removal, etc. have also been discussed.  Questions have been answered.  All parties agreeable. Please see the history and physical in the medical record for more information.  MEDICATIONS: Versed 5 mg IV and Demerol 100 mg IV in divided doses. Zofran 4 mg IV.  DESCRIPTION OF PROCEDURE:  After a digital rectal exam was performed, the EC-3890Li ([id number])  colonoscope was advanced from the anus through the rectum and colon to the area of the cecum, ileocecal valve and appendiceal orifice.  The cecum was deeply intubated.  These structures were well-seen and photographed for the record.  From the level of the cecum and ileocecal valve, the scope was slowly and cautiously withdrawn.  The mucosal surfaces were carefully surveyed utilizing scope tip deflection to facilitate fold flattening as needed.  The scope was pulled down into the rectum where a thorough examination including retroflexion was performed.    FINDINGS:  Adequate preparation. (1) 3 mm diminutive polyp in the rectum at 5 cm from the anal verge; otherwise, the remainder of the rectal mucosa appeared normal.  The patient had a second diminutive polyp in the mid sigmoid segment. The patient had (2) 7 mm polyps in the mid descending segment; the patient had multiple  erosions in the base of the cecum surrounding the appendiceal orifice of uncertain significance; the remainder of the colonic mucosaas well the distal 10 cm of terminal ileal mucosa appeared normal.  THERAPEUTIC / DIAGNOSTIC MANEUVERS PERFORMED:  biopsies of the cecum taken for histologic study. The 2 descending polyps were removed with hot snare cautery technique. The diminutive rectosigmoid polyps were ablated with the tip of the hot snare loop.  COMPLICATIONS: none  CECAL WITHDRAWAL TIME:  15 minutes  IMPRESSION:  Multiple rectal and colonic polyps treated/removed as described above. Abnormal cecum (query Goody powder effect) used. Status post biopsy.  RECOMMENDATIONS:  Followup on pathology. Further recommendations to follow.   _______________________________ eSigned:  R. Garfield Cornea, MD FACP [facility] 03/24/2014 9:44 AM   CC:    PATIENT NAME:  Ryan, Jennings MR#: [id number]

## 2014-03-24 NOTE — H&P (View-Only) (Signed)
Referring Provider: Chipper Herb, MD Primary Care Physician:  Haywood Pao, MD Primary GI: Dr. [name]   Chief Complaint  Patient presents with  . Medication Refill    HPI:   Ryan Jennings presents today in routine follow-up with a history of GERD. Last seen in 2012 and underwent EGD due to dysphagia with findings of erosive reflux esophagitis, Schatzki's ring and early stricture s/p dilation. No GERD exacerbations. Dexilant works well. No dysphagia. No abdominal pain. Last colonoscopy in 2010 with adenomatous polyp. Due for surveillance now. No lower GI complaints.   Past Medical History  Diagnosis Date  . GERD (gastroesophageal reflux disease)   . Nephrolithiasis   . Esophageal ring     2010, 2012  . Adenomatous colon polyp 2010  . Hyperlipidemia     Past Surgical History  Procedure Laterality Date  . Appendectomy  8/11  . Rotator cuff repair      right  . Hernia repair      x 3  . Biceps tendon repair    . Esophagogastroduodenoscopy  04/2009    distal erosive reflux esophagitis, schatzki ring s/p 51F, hh, couple of antral erosions  . Colonoscopy  04/2009    sigmoid adenomatous polyp, next colonoscopy 04/2014  . Esophagogastroduodenoscopy  05/2011    Dr. [name]: erosive reflux esophagitis, superimposed on Schatzki's ring and early stricture s/p 31 F, small hiatal hernia    Current Outpatient Prescriptions  Medication Sig Dispense Refill  . atorvastatin (LIPITOR) 20 MG tablet Take 20 mg by mouth daily.      Marland Kitchen dexlansoprazole (DEXILANT) 60 MG capsule Take 1 capsule (60 mg total) by mouth daily.  30 capsule  11  . meloxicam (MOBIC) 7.5 MG tablet Take 7.5 mg by mouth as needed for pain.      . polyethylene glycol-electrolytes (TRILYTE) 420 G solution Take 4,000 mLs by mouth as directed.  4000 mL  0   No current facility-administered medications for this visit.    Allergies as of 03/17/2014 - Review Complete 03/17/2014  Allergen Reaction Noted  .  Penicillins Rash     Family History  Problem Relation Age of Onset  . Lymphoma Mother 17    deceased  . Colon polyps Father     before age of 40  . Stroke Paternal Grandfather 64  . Pancreatic cancer Maternal Grandfather   . Colon cancer Neg Hx     History   Social History  . Marital Status: Married    Spouse Name: N/A    Number of Children: 3  . Years of Education: N/A   Occupational History  .  [employer]   Social History Main Topics  . Smoking status: Never Smoker   . Smokeless tobacco: None  . Alcohol Use: No  . Drug Use: No  . Sexual Activity: None   Other Topics Concern  . None   Social History Narrative  . None    Review of Systems: As mentioned in HPI.   Physical Exam: BP 138/72  Pulse 67  Temp(Src) 97.8 F (36.6 C) (Oral)  Resp 20  Ht 5\' 8"  (1.727 m)  Wt 174 lb (78.926 kg)  BMI 26.46 kg/m2 General:   Alert and oriented. No distress noted. Pleasant and cooperative.  Head:  Normocephalic and atraumatic. Eyes:  Conjuctiva clear without scleral icterus. Mouth:  Oral mucosa pink and moist. Good dentition. No lesions. Heart:  S1, S2 present without murmurs, rubs, or gallops. Regular rate  and rhythm. Abdomen:  +BS, soft, non-tender and non-distended. No rebound or guarding. No HSM or masses noted. Msk:  Symmetrical without gross deformities. Normal posture. Extremities:  Without edema. Neurologic:  Alert and  oriented x4;  grossly normal neurologically. Skin:  Intact without significant lesions or rashes. Psych:  Alert and cooperative. Normal mood and affect.

## 2014-03-26 ENCOUNTER — Encounter: Payer: Self-pay | Admitting: Internal Medicine

## 2014-03-28 ENCOUNTER — Encounter (HOSPITAL_COMMUNITY): Payer: Self-pay | Admitting: Internal Medicine

## 2014-11-19 ENCOUNTER — Telehealth (HOSPITAL_COMMUNITY): Payer: Self-pay | Admitting: Cardiology

## 2014-11-19 NOTE — Telephone Encounter (Signed)
Received records from [facility] for appointment with Dr Percival Spanish on 12/22/14.  Records given to [facility] (medical records) for Dr Hochrein's schedule on 12/22/14.  lp

## 2014-12-22 ENCOUNTER — Ambulatory Visit: Payer: Self-pay | Admitting: Cardiology

## 2015-01-15 ENCOUNTER — Telehealth: Payer: Self-pay | Admitting: Cardiology

## 2015-01-15 NOTE — Telephone Encounter (Signed)
Received records from [facility] for appointment with Dr Percival Spanish on 01/19/15.  Records given to [facility] (medical records) for Dr Hochrein's schedule on 01/19/15. lp

## 2015-01-19 ENCOUNTER — Ambulatory Visit (INDEPENDENT_AMBULATORY_CARE_PROVIDER_SITE_OTHER)
Admission: RE | Admit: 2015-01-19 | Discharge: 2015-01-19 | Disposition: A | Discharge status: Home / Self Care | Payer: 59 | Source: Ambulatory Visit | Attending: Cardiology | Admitting: Cardiology

## 2015-01-19 ENCOUNTER — Ambulatory Visit (INDEPENDENT_AMBULATORY_CARE_PROVIDER_SITE_OTHER): Payer: 59 | Admitting: Cardiology

## 2015-01-19 ENCOUNTER — Encounter: Payer: Self-pay | Admitting: Cardiology

## 2015-01-19 VITALS — BP 118/80 | HR 61 | Ht 68.0 in | Wt 167.0 lb

## 2015-01-19 DIAGNOSIS — R0789 Other chest pain: Secondary | ICD-10-CM

## 2015-01-19 NOTE — Patient Instructions (Signed)
Your physician recommends that you schedule a follow-up appointment in: as needed with Dr. Percival Spanish  We are ordering a calcium score for you to get done

## 2015-01-19 NOTE — Progress Notes (Signed)
Cardiology Office Note   Date:  01/19/2015   ID:  Ryan Jennings, DOB 28-Sep-1960, MRN [id number]  PCP:  Haywood Pao, MD  Cardiologist:   Minus Breeding, MD   No chief complaint on file.     History of Present Illness: Ryan Jennings is a 54 y.o. male who presents for presents for evaluation of chest pain. I saw him in 2012 and he had a negative treadmill test. He said that in March she had some chest discomfort. This happened one "Sunday while he was getting ready for church. He was somewhat sharp. It is midsternal discomfort that was moderate to severe in intensity. It lasted for several minutes. He went to drink a glass of water and it slowly went away. There was no radiation to his jaw or to his arms. There were no associated symptoms such as nausea vomiting or diaphoresis or shortness of breath. He otherwise is active at work though he doesn't exercise routinely. With this he denies any chest pressure, neck or arm discomfort. He's had no shortness of breath, PND or orthopnea. He's had no weight gain or edema.   Past Medical History  Diagnosis Date  . GERD (gastroesophageal reflux disease)   . Nephrolithiasis   . Esophageal ring     20" 10, 2012  . Adenomatous colon polyp 2010  . Hyperlipidemia   . PONV (postoperative nausea and vomiting)     Past Surgical History  Procedure Laterality Date  . Appendectomy  8/11  . Rotator cuff repair      right  . Hernia repair      x 3  . Biceps tendon repair    . Esophagogastroduodenoscopy  04/2009    distal erosive reflux esophagitis, schatzki ring s/p 37F, hh, couple of antral erosions  . Colonoscopy  04/2009    sigmoid adenomatous polyp, next colonoscopy 04/2014  . Esophagogastroduodenoscopy  05/2011    Dr. [name]: erosive reflux esophagitis, superimposed on Schatzki's ring and early stricture s/p 2 F, small hiatal hernia  . Colonoscopy N/A 03/24/2014    Procedure: COLONOSCOPY;  Surgeon: [name], MD;  Location: [facility];  Service: Endoscopy;  Laterality: N/A;  1:45-moved to [place] notified pt     Current Outpatient Prescriptions  Medication Sig Dispense Refill  . atorvastatin (LIPITOR) 20 MG tablet Take 20 mg by mouth daily.    Marland Kitchen dexlansoprazole (DEXILANT) 60 MG capsule Take 1 capsule (60 mg total) by mouth daily. 30 capsule 11  . meloxicam (MOBIC) 7.5 MG tablet Take 7.5 mg by mouth as needed for pain.     No current facility-administered medications for this visit.    Allergies:   Penicillins    Social History:  The patient  reports that he has never smoked. He does not have any smokeless tobacco history on file. He reports that he does not drink alcohol or use illicit drugs.   Family History:  The patient's family history includes Colon polyps in his father; Lymphoma (age of onset: 53) in his mother; Pancreatic cancer in his maternal grandfather; Stroke (age of onset: 74) in his paternal grandfather. There is no history of Colon cancer.    ROS:  Please see the history of present illness.   Otherwise, review of systems are positive for none.   All other systems are reviewed and negative.    PHYSICAL EXAM: VS:  BP 118/80 mmHg  Pulse 61  Ht 5\' 8"  (1.727 m)  Wt 167 lb (  75.751 kg)  BMI 25.40 kg/m2 , BMI Body mass index is 25.4 kg/(m^2). GENERAL:  Well appearing HEENT:  Pupils equal round and reactive, fundi not visualized, oral mucosa unremarkable NECK:  No jugular venous distention, waveform within normal limits, carotid upstroke brisk and symmetric, no bruits, no thyromegaly LYMPHATICS:  No cervical, inguinal adenopathy LUNGS:  Clear to auscultation bilaterally BACK:  No CVA tenderness CHEST:  Unremarkable HEART:  PMI not displaced or sustained,S1 and S2 within normal limits, no S3, no S4, no clicks, no rubs, no take a nap mantle from been some murmurs ABD:  Flat, positive bowel sounds normal in frequency in pitch, no bruits, no rebound, no guarding, no midline pulsatile mass, no  hepatomegaly, no splenomegaly EXT:  2 plus pulses throughout, no edema, no cyanosis no clubbing SKIN:  No rashes no nodules NEURO:  Cranial nerves II through XII grossly intact, motor grossly intact throughout PSYCH:  Cognitively intact, oriented to person place and time    EKG:  EKG is ordered today. The ekg ordered today demonstrates sinus rhythm, rate 61, axis within normal limits, intervals within normal limits, no acute ST-T wave changes.   Recent Labs: No results found for requested labs within last 365 days.    Lipid Panel No results found for: CHOL, TRIG, HDL, CHOLHDL, VLDL, LDLCALC, LDLDIRECT    Wt Readings from Last 3 Encounters:  01/19/15 167 lb (75.751 kg)  03/24/14 170 lb (77.111 kg)  03/17/14 174 lb (78.926 kg)      Other studies Reviewed: Additional studies/ records that were reviewed today include: Office recrods. Review of the above records demonstrates:  Please see elsewhere in the note.     ASSESSMENT AND PLAN:  CHEST PAIN:  The pain was somewhat atypical. However, he does have some risk factors.  I will bring him back for a coronary calcium score to risk stratify further guide primary therapy.  DYSLIPIDEMIA:  I will defer to Haywood Pao, MD .  We can discuss any changes in management based on the calcium score.  Current medicines are reviewed at length with the patient today.  The patient does not have concerns regarding medicines.  The following changes have been made:  no change  Labs/ tests ordered today include:  Orders Placed This Encounter  Procedures  . EKG 12-Lead     Disposition:   FU with me as needed    Signed, Minus Breeding, MD  01/19/2015 9:57 AM    [facility]

## 2015-01-21 ENCOUNTER — Encounter: Payer: Self-pay | Admitting: Cardiology

## 2015-05-12 ENCOUNTER — Other Ambulatory Visit: Payer: Self-pay

## 2015-05-13 MED ORDER — DEXLANSOPRAZOLE 60 MG PO CPDR: 60.0000 mg | DELAYED_RELEASE_CAPSULE | Freq: Every day | ORAL | Status: DC

## 2015-05-28 ENCOUNTER — Encounter (HOSPITAL_BASED_OUTPATIENT_CLINIC_OR_DEPARTMENT_OTHER): Payer: Self-pay | Admitting: Physician Assistant

## 2015-05-28 DIAGNOSIS — Z9889 Other specified postprocedural states: Secondary | ICD-10-CM | POA: Diagnosis present

## 2015-05-28 DIAGNOSIS — R112 Nausea with vomiting, unspecified: Secondary | ICD-10-CM | POA: Diagnosis present

## 2015-05-28 DIAGNOSIS — S46211A Strain of muscle, fascia and tendon of other parts of biceps, right arm, initial encounter: Secondary | ICD-10-CM | POA: Diagnosis present

## 2015-05-28 NOTE — H&P (Signed)
Ryan Jennings is an 54 y.o. male.   Chief Complaint: right elbow pain HPI: Mr. Ryan Jennings is a 54 year old who is seen for an acute injury to his right elbow that occurred with lifting a month ago. He had significant pain and weakness.  He has a remote history of left elbow distal biceps tendon rupture. This does bother him significantly, although in the left elbow he had retraction at the time of his injury, this time he does not. He cannot do heavy lifting, turning or twisting with the right elbow.   Past Medical History  Diagnosis Date  . GERD (gastroesophageal reflux disease)   . Nephrolithiasis   . Esophageal ring     2010, 2012  . Adenomatous colon polyp 2010  . Hyperlipidemia   . PONV (postoperative nausea and vomiting)   . Traumatic rupture of right distal biceps tendon   . PONV (postoperative nausea and vomiting)     Past Surgical History  Procedure Laterality Date  . Appendectomy  8/11  . Rotator cuff repair      right  . Hernia repair      x 3  . Biceps tendon repair    . Esophagogastroduodenoscopy  04/2009    distal erosive reflux esophagitis, schatzki ring s/p [name], hh, couple of antral erosions  . Colonoscopy  04/2009    sigmoid adenomatous polyp, next colonoscopy 04/2014  . Esophagogastroduodenoscopy  05/2011    Dr. Gala Jennings: erosive reflux esophagitis, superimposed on Schatzki's ring and early stricture s/p 22 F, small hiatal hernia  . Colonoscopy N/A 03/24/2014    Procedure: COLONOSCOPY;  Surgeon: Ryan Dolin, MD;  [facility];  Service: Endoscopy;  Laterality: N/A;  1:45-moved to [place] notified pt    Family History  Problem Relation Age of Onset  . Lymphoma Mother 80    deceased  . Colon polyps Father     before age of 61  . Stroke Paternal Grandfather 68  . Pancreatic cancer Maternal Grandfather   . Colon cancer Neg Hx    Social History:  reports that he has never smoked. He does not have any smokeless tobacco history on file. He reports  that he does not drink alcohol or use illicit drugs.  Allergies:  Allergies  Allergen Reactions  . Penicillins Rash    No prescriptions prior to admission    No results found for this or any previous visit (from the past 48 hour(s)). No results found.  Review of Systems  Constitutional: Negative.   HENT: Negative.   Eyes: Negative.   Respiratory: Negative.   Cardiovascular: Negative.   Gastrointestinal: Negative.   Genitourinary: Negative.   Musculoskeletal: Positive for joint pain.  Skin: Negative.   Neurological: Negative.   Endo/Heme/Allergies: Negative.   Psychiatric/Behavioral: Negative.     There were no vitals taken for this visit. Physical Exam  Constitutional: He is oriented to person, place, and time. He appears well-developed and well-nourished.  HENT:  Head: Normocephalic and atraumatic.  Mouth/Throat: Oropharynx is clear and moist.  Eyes: Conjunctivae are normal. Pupils are equal, round, and reactive to light.  Neck: Neck supple.  Cardiovascular: Normal rate.   Respiratory: Effort normal.  GI: Soft.  Genitourinary:  Not pertinent to current symptomatology therefore not examined.  Musculoskeletal:  Examination of his right elbow reveals pain over the distal biceps. The biceps tendon appears at least partially intact. He has significant pain and weakness on forced supination and pronation. A full range of  motion is noted.  Examination of the left elbow reveals a full range of motion without pain, swelling, weakness or instability. Vascular exam: pulses 2+ and symmetric.    Neurological: He is alert and oriented to person, place, and time.  Skin: Skin is warm and dry.  Psychiatric: He has a normal mood and affect.     Assessment Principal Problem:   Traumatic rupture of right distal biceps tendon Active Problems:   GERD (gastroesophageal reflux disease)   PONV (postoperative nausea and vomiting)   Plan Right elbow distal biceps tendon repair.  The  risks, benefits, and possible complications of the procedure were discussed in detail with the patient.  The patient is without question.  Ryan Jennings 05/28/2015, 3:23 PM

## 2015-05-29 ENCOUNTER — Encounter (HOSPITAL_BASED_OUTPATIENT_CLINIC_OR_DEPARTMENT_OTHER): Payer: Self-pay | Admitting: *Deleted

## 2015-06-03 ENCOUNTER — Encounter (HOSPITAL_BASED_OUTPATIENT_CLINIC_OR_DEPARTMENT_OTHER): Payer: Self-pay | Admitting: Certified Registered"

## 2015-06-03 ENCOUNTER — Ambulatory Visit (HOSPITAL_BASED_OUTPATIENT_CLINIC_OR_DEPARTMENT_OTHER): Payer: Commercial Managed Care - HMO | Admitting: Certified Registered"

## 2015-06-03 ENCOUNTER — Ambulatory Visit (HOSPITAL_BASED_OUTPATIENT_CLINIC_OR_DEPARTMENT_OTHER)
Admission: RE | Admit: 2015-06-03 | Discharge: 2015-06-03 | Disposition: A | Discharge status: Home / Self Care | Payer: Commercial Managed Care - HMO | Source: Ambulatory Visit | Attending: Orthopedic Surgery | Admitting: Orthopedic Surgery

## 2015-06-03 ENCOUNTER — Encounter (HOSPITAL_BASED_OUTPATIENT_CLINIC_OR_DEPARTMENT_OTHER)
Admission: RE | Disposition: A | Discharge status: Home / Self Care | Payer: Self-pay | Source: Ambulatory Visit | Attending: Orthopedic Surgery

## 2015-06-03 DIAGNOSIS — K219 Gastro-esophageal reflux disease without esophagitis: Secondary | ICD-10-CM | POA: Diagnosis present

## 2015-06-03 DIAGNOSIS — X58XXXA Exposure to other specified factors, initial encounter: Secondary | ICD-10-CM | POA: Insufficient documentation

## 2015-06-03 DIAGNOSIS — E785 Hyperlipidemia, unspecified: Secondary | ICD-10-CM | POA: Insufficient documentation

## 2015-06-03 DIAGNOSIS — S46211A Strain of muscle, fascia and tendon of other parts of biceps, right arm, initial encounter: Secondary | ICD-10-CM | POA: Diagnosis not present

## 2015-06-03 DIAGNOSIS — Z880 Allergy status to penicillin: Secondary | ICD-10-CM | POA: Insufficient documentation

## 2015-06-03 DIAGNOSIS — R112 Nausea with vomiting, unspecified: Secondary | ICD-10-CM | POA: Insufficient documentation

## 2015-06-03 DIAGNOSIS — Z9889 Other specified postprocedural states: Secondary | ICD-10-CM | POA: Diagnosis present

## 2015-06-03 HISTORY — DX: Strain of muscle, fascia and tendon of other parts of biceps, right arm, initial encounter: S46.211A

## 2015-06-03 HISTORY — PX: DISTAL BICEPS TENDON REPAIR: SHX1461

## 2015-06-03 SURGERY — REPAIR, TENDON, BICEPS, DISTAL
Anesthesia: Regional | Site: Elbow | Laterality: Right

## 2015-06-03 MED ORDER — MIDAZOLAM HCL 2 MG/2ML IJ SOLN
1.0000 mg | INTRAMUSCULAR | Status: DC | PRN
  Administered 2015-06-03: 2 mg via INTRAVENOUS

## 2015-06-03 MED ORDER — SCOPOLAMINE 1 MG/3DAYS TD PT72
MEDICATED_PATCH | TRANSDERMAL | Status: AC
  Filled 2015-06-03: qty 1

## 2015-06-03 MED ORDER — CHLORHEXIDINE GLUCONATE 4 % EX LIQD: 60.0000 mL | Freq: Once | CUTANEOUS | Status: DC

## 2015-06-03 MED ORDER — HYDROMORPHONE HCL 1 MG/ML IJ SOLN: 0.2500 mg | INTRAMUSCULAR | Status: DC | PRN

## 2015-06-03 MED ORDER — LACTATED RINGERS IV SOLN
INTRAVENOUS | Status: DC
  Administered 2015-06-03 (×2): via INTRAVENOUS

## 2015-06-03 MED ORDER — VANCOMYCIN HCL IN DEXTROSE 1-5 GM/200ML-% IV SOLN
1000.0000 mg | INTRAVENOUS | Status: AC
  Administered 2015-06-03: 1000 mg via INTRAVENOUS

## 2015-06-03 MED ORDER — OXYCODONE HCL 5 MG PO TABS
5.0000 mg | ORAL_TABLET | Freq: Once | ORAL | Status: DC | PRN
Start: 2015-06-03 — End: 2015-06-03

## 2015-06-03 MED ORDER — LIDOCAINE HCL (CARDIAC) 20 MG/ML IV SOLN
INTRAVENOUS | Status: DC | PRN
  Administered 2015-06-03: 70 mg via INTRAVENOUS

## 2015-06-03 MED ORDER — DEXAMETHASONE SODIUM PHOSPHATE 10 MG/ML IJ SOLN
INTRAMUSCULAR | Status: AC
  Filled 2015-06-03: qty 1

## 2015-06-03 MED ORDER — DEXAMETHASONE SODIUM PHOSPHATE 10 MG/ML IJ SOLN
INTRAMUSCULAR | Status: DC | PRN
  Administered 2015-06-03: 10 mg via INTRAVENOUS

## 2015-06-03 MED ORDER — GLYCOPYRROLATE 0.2 MG/ML IJ SOLN: 0.2000 mg | Freq: Once | INTRAMUSCULAR | Status: DC | PRN

## 2015-06-03 MED ORDER — SCOPOLAMINE 1 MG/3DAYS TD PT72
1.0000 | MEDICATED_PATCH | Freq: Once | TRANSDERMAL | Status: AC | PRN
  Administered 2015-06-03: 1 via TRANSDERMAL

## 2015-06-03 MED ORDER — BUPIVACAINE-EPINEPHRINE (PF) 0.5% -1:200000 IJ SOLN
INTRAMUSCULAR | Status: DC | PRN
  Administered 2015-06-03: 30 mL via PERINEURAL

## 2015-06-03 MED ORDER — MIDAZOLAM HCL 2 MG/2ML IJ SOLN
INTRAMUSCULAR | Status: AC
  Filled 2015-06-03: qty 2

## 2015-06-03 MED ORDER — VANCOMYCIN HCL IN DEXTROSE 1-5 GM/200ML-% IV SOLN
INTRAVENOUS | Status: AC
  Filled 2015-06-03: qty 200

## 2015-06-03 MED ORDER — FENTANYL CITRATE (PF) 100 MCG/2ML IJ SOLN
INTRAMUSCULAR | Status: AC
  Filled 2015-06-03: qty 2

## 2015-06-03 MED ORDER — OXYCODONE HCL 5 MG PO TABS: ORAL_TABLET | ORAL | Status: DC

## 2015-06-03 MED ORDER — OXYCODONE HCL 5 MG/5ML PO SOLN: 5.0000 mg | Freq: Once | ORAL | Status: DC | PRN

## 2015-06-03 MED ORDER — FENTANYL CITRATE (PF) 100 MCG/2ML IJ SOLN
50.0000 ug | INTRAMUSCULAR | Status: DC | PRN
  Administered 2015-06-03: 100 ug via INTRAVENOUS

## 2015-06-03 MED ORDER — PROPOFOL 10 MG/ML IV BOLUS
INTRAVENOUS | Status: AC
  Filled 2015-06-03: qty 20

## 2015-06-03 MED ORDER — ONDANSETRON HCL 4 MG/2ML IJ SOLN
INTRAMUSCULAR | Status: DC | PRN
  Administered 2015-06-03: 4 mg via INTRAVENOUS

## 2015-06-03 MED ORDER — ONDANSETRON HCL 4 MG/2ML IJ SOLN: 4.0000 mg | Freq: Four times a day (QID) | INTRAMUSCULAR | Status: DC | PRN

## 2015-06-03 MED ORDER — DIAZEPAM 2 MG PO TABS: 2.0000 mg | ORAL_TABLET | Freq: Three times a day (TID) | ORAL | Status: DC | PRN

## 2015-06-03 MED ORDER — ONDANSETRON HCL 4 MG/2ML IJ SOLN
INTRAMUSCULAR | Status: AC
  Filled 2015-06-03: qty 2

## 2015-06-03 MED ORDER — PROPOFOL 10 MG/ML IV BOLUS
INTRAVENOUS | Status: DC | PRN
  Administered 2015-06-03: 200 mg via INTRAVENOUS

## 2015-06-03 SURGICAL SUPPLY — 81 items
BANDAGE ELASTIC 4 VELCRO ST LF (GAUZE/BANDAGES/DRESSINGS) ×3 IMPLANT
BANDAGE ELASTIC 6 VELCRO ST LF (GAUZE/BANDAGES/DRESSINGS) ×3 IMPLANT
BENZOIN TINCTURE PRP APPL 2/3 (GAUZE/BANDAGES/DRESSINGS) ×3 IMPLANT
BLADE SURG 15 STRL LF DISP TIS (BLADE) ×1 IMPLANT
BLADE SURG 15 STRL SS (BLADE) ×2
BNDG COHESIVE 4X5 TAN STRL (GAUZE/BANDAGES/DRESSINGS) ×3 IMPLANT
BNDG ESMARK 4X9 LF (GAUZE/BANDAGES/DRESSINGS) ×3 IMPLANT
BNDG GAUZE ELAST 4 BULKY (GAUZE/BANDAGES/DRESSINGS) ×3 IMPLANT
BUTTON DISTAL BICEPS (Orthopedic Implant) ×3 IMPLANT
CLOSURE STERI-STRIP 1/2X4 (GAUZE/BANDAGES/DRESSINGS) ×1
CLOSURE WOUND 1/2 X4 (GAUZE/BANDAGES/DRESSINGS)
CLSR STERI-STRIP ANTIMIC 1/2X4 (GAUZE/BANDAGES/DRESSINGS) ×2 IMPLANT
COVER BACK TABLE 60X90IN (DRAPES) ×3 IMPLANT
CUFF TOURNIQUET SINGLE 18IN (TOURNIQUET CUFF) ×3 IMPLANT
DRAPE EXTREMITY T 121X128X90 (DRAPE) ×3 IMPLANT
DRAPE OEC MINIVIEW 54X84 (DRAPES) ×3 IMPLANT
DRAPE U 20/CS (DRAPES) ×3 IMPLANT
DRAPE U-SHAPE 47X51 STRL (DRAPES) ×3 IMPLANT
DRSG EMULSION OIL 3X3 NADH (GAUZE/BANDAGES/DRESSINGS) ×3 IMPLANT
DURAPREP 26ML APPLICATOR (WOUND CARE) ×3 IMPLANT
ELECT REM PT RETURN 9FT ADLT (ELECTROSURGICAL) ×3
ELECTRODE REM PT RTRN 9FT ADLT (ELECTROSURGICAL) ×1 IMPLANT
GAUZE SPONGE 4X4 12PLY STRL (GAUZE/BANDAGES/DRESSINGS) ×3 IMPLANT
GAUZE XEROFORM 1X8 LF (GAUZE/BANDAGES/DRESSINGS) ×3 IMPLANT
GLOVE BIO SURGEON STRL SZ 6.5 (GLOVE) ×2 IMPLANT
GLOVE BIO SURGEON STRL SZ7 (GLOVE) ×3 IMPLANT
GLOVE BIO SURGEONS STRL SZ 6.5 (GLOVE) ×1
GLOVE BIOGEL PI IND STRL 7.0 (GLOVE) ×2 IMPLANT
GLOVE BIOGEL PI IND STRL 7.5 (GLOVE) ×1 IMPLANT
GLOVE BIOGEL PI INDICATOR 7.0 (GLOVE) ×4
GLOVE BIOGEL PI INDICATOR 7.5 (GLOVE) ×2
GLOVE SS BIOGEL STRL SZ 7.5 (GLOVE) ×1 IMPLANT
GLOVE SUPERSENSE BIOGEL SZ 7.5 (GLOVE) ×2
GOWN STRL REUS W/ TWL LRG LVL3 (GOWN DISPOSABLE) ×3 IMPLANT
GOWN STRL REUS W/TWL LRG LVL3 (GOWN DISPOSABLE) ×6
INSERTER BUTTON (SYSTAGENIX WOUND MANAGEMENT) ×3 IMPLANT
LOOP VESSEL MAXI BLUE (MISCELLANEOUS) IMPLANT
NDL SAFETY ECLIPSE 18X1.5 (NEEDLE) IMPLANT
NDL SUT 6 .5 CRC .975X.05 MAYO (NEEDLE) IMPLANT
NEEDLE ADDISON D1/2 CIR (NEEDLE) IMPLANT
NEEDLE FISTULA 1/2 CIRCLE (NEEDLE) ×3 IMPLANT
NEEDLE HYPO 18GX1.5 SHARP (NEEDLE)
NEEDLE HYPO 25X1 1.5 SAFETY (NEEDLE) ×3 IMPLANT
NEEDLE MAYO TAPER (NEEDLE)
NS IRRIG 1000ML POUR BTL (IV SOLUTION) ×3 IMPLANT
PACK BASIN DAY SURGERY FS (CUSTOM PROCEDURE TRAY) ×3 IMPLANT
PAD CAST 3X4 CTTN HI CHSV (CAST SUPPLIES) ×1 IMPLANT
PAD CAST 4YDX4 CTTN HI CHSV (CAST SUPPLIES) ×1 IMPLANT
PADDING CAST ABS 4INX4YD NS (CAST SUPPLIES) ×2
PADDING CAST ABS COTTON 4X4 ST (CAST SUPPLIES) ×1 IMPLANT
PADDING CAST COTTON 3X4 STRL (CAST SUPPLIES) ×2
PADDING CAST COTTON 4X4 STRL (CAST SUPPLIES) ×2
PASSER SUT SWANSON 36MM LOOP (INSTRUMENTS) IMPLANT
PENCIL BUTTON HOLSTER BLD 10FT (ELECTRODE) ×3 IMPLANT
PIN DRILL ACL TIGHTROPE 4MM (PIN) ×3 IMPLANT
SLEEVE SCD COMPRESS KNEE MED (MISCELLANEOUS) ×3 IMPLANT
SLING ARM FOAM STRAP LRG (SOFTGOODS) ×3 IMPLANT
SPLINT FAST PLASTER 5X30 (CAST SUPPLIES)
SPLINT PLASTER CAST FAST 5X30 (CAST SUPPLIES) IMPLANT
STAPLER VISISTAT (STAPLE) IMPLANT
STOCKINETTE IMPERVIOUS LG (DRAPES) ×3 IMPLANT
STRIP CLOSURE SKIN 1/2X4 (GAUZE/BANDAGES/DRESSINGS) IMPLANT
SUCTION FRAZIER TIP 10 FR DISP (SUCTIONS) ×3 IMPLANT
SUT 2 FIBERLOOP 20 STRT BLUE (SUTURE) ×6
SUT ETHILON 4 0 PS 2 18 (SUTURE) ×3 IMPLANT
SUT FIBERWIRE 2-0 18 17.9 3/8 (SUTURE)
SUT MNCRL AB 3-0 PS2 18 (SUTURE) ×3 IMPLANT
SUT PROLENE 3 0 PS 2 (SUTURE) IMPLANT
SUT VIC AB 0 CT1 27 (SUTURE)
SUT VIC AB 0 CT1 27XBRD ANBCTR (SUTURE) IMPLANT
SUT VIC AB 2-0 SH 27 (SUTURE) ×8
SUT VIC AB 2-0 SH 27XBRD (SUTURE) ×4 IMPLANT
SUTURE 2 FIBERLOOP 20 STRT BLU (SUTURE) ×2 IMPLANT
SUTURE FIBERWR 2-0 18 17.9 3/8 (SUTURE) IMPLANT
SYR BULB 3OZ (MISCELLANEOUS) ×3 IMPLANT
SYR CONTROL 10ML LL (SYRINGE) ×3 IMPLANT
TOWEL OR 17X24 6PK STRL BLUE (TOWEL DISPOSABLE) ×6 IMPLANT
TUBE CONNECTING 20'X1/4 (TUBING)
TUBE CONNECTING 20X1/4 (TUBING) IMPLANT
UNDERPAD 30X30 (UNDERPADS AND DIAPERS) ×3 IMPLANT
YANKAUER SUCT BULB TIP NO VENT (SUCTIONS) ×3 IMPLANT

## 2015-06-03 NOTE — Interval H&P Note (Signed)
History and Physical Interval Note:  06/03/2015 11:25 AM  Ryan Jennings  has presented today for surgery, with the diagnosis of right distal biceps tendon rupture  The various methods of treatment have been discussed with the patient and family. After consideration of risks, benefits and other options for treatment, the patient has consented to  Procedure(s): RIGHT DISTAL BICEPS TENDON REPAIR (Right) as a surgical intervention .  The patient's history has been reviewed, patient examined, no change in status, stable for surgery.  I have reviewed the patient's chart and labs.  Questions were answered to the patient's satisfaction.     Elsie Saas A

## 2015-06-03 NOTE — Anesthesia Procedure Notes (Addendum)
Anesthesia Regional Block:  Supraclavicular block  Pre-Anesthetic Checklist: ,, timeout performed, Correct Patient, Correct Site, Correct Laterality, Correct Procedure, Correct Position, site marked, Risks and benefits discussed,  Surgical consent,  Pre-op evaluation,  At surgeon's request and post-op pain management  Laterality: Right  Prep: chloraprep       Needles:  Injection technique: Single-shot  Needle Type: Echogenic Stimulator Needle     Needle Length: 5cm 5 cm Needle Gauge: 22 and 22 G    Additional Needles:  Procedures: ultrasound guided (picture in chart) and nerve stimulator Supraclavicular block  Nerve Stimulator or Paresthesia:  Response: biceps flexion, 0.45 mA,   Additional Responses:   Narrative:  Start time: 06/03/2015 10:27 AM End time: 06/03/2015 10:35 AM Injection made incrementally with aspirations every 5 mL.  Performed by: Personally  Anesthesiologist: HODIERNE, ADAM  Additional Notes: Functioning IV was confirmed and monitors were applied.  A 78mm 22ga Arrow echogenic stimulator needle was used. Sterile prep and drape,hand hygiene and sterile gloves were used.  Negative aspiration and negative test dose prior to incremental administration of local anesthetic. The patient tolerated the procedure well.  Ultrasound guidance: relevent anatomy identified, needle position confirmed, local anesthetic spread visualized around nerve(s), vascular puncture avoided.  Image printed for medical record.    Procedure Name: LMA Insertion Performed by: Tawni Millers Pre-anesthesia Checklist: Patient identified, Emergency Drugs available, Suction available and Patient being monitored Patient Re-evaluated:Patient Re-evaluated prior to inductionOxygen Delivery Method: Circle System Utilized Preoxygenation: Pre-oxygenation with 100% oxygen Intubation Type: IV induction Ventilation: Mask ventilation without difficulty LMA: LMA inserted LMA Size: 5.0 Number of  attempts: 1 Airway Equipment and Method: Bite block Placement Confirmation: positive ETCO2 Tube secured with: Tape Dental Injury: Teeth and Oropharynx as per pre-operative assessment

## 2015-06-03 NOTE — Progress Notes (Signed)
Assisted Dr. Hodierne with right, ultrasound guided, supraclavicular block. Side rails up, monitors on throughout procedure. See vital signs in flow sheet. Tolerated Procedure well.  

## 2015-06-03 NOTE — Anesthesia Preprocedure Evaluation (Signed)
Anesthesia Evaluation  Patient identified by MRN, date of birth, ID band Patient awake    Reviewed: Allergy & Precautions, NPO status , Patient's Chart, lab work & pertinent test results  History of Anesthesia Complications (+) PONV  Airway Mallampati: II   Neck ROM: full    Dental   Pulmonary neg pulmonary ROS,    breath sounds clear to auscultation       Cardiovascular negative cardio ROS   Rhythm:regular Rate:Normal     Neuro/Psych    GI/Hepatic GERD  ,  Endo/Other    Renal/GU      Musculoskeletal   Abdominal   Peds  Hematology   Anesthesia Other Findings   Reproductive/Obstetrics                             Anesthesia Physical Anesthesia Plan  ASA: II  Anesthesia Plan: General and Regional   Post-op Pain Management: MAC Combined w/ Regional for Post-op pain   Induction: Intravenous  Airway Management Planned: LMA  Additional Equipment:   Intra-op Plan:   Post-operative Plan:   Informed Consent: I have reviewed the patients History and Physical, chart, labs and discussed the procedure including the risks, benefits and alternatives for the proposed anesthesia with the patient or authorized representative who has indicated his/her understanding and acceptance.     Plan Discussed with: CRNA, Anesthesiologist and Surgeon  Anesthesia Plan Comments:         Anesthesia Quick Evaluation

## 2015-06-03 NOTE — Discharge Instructions (Signed)
° ° °  Regional Anesthesia Blocks ° °1. Numbness or the inability to move the "blocked" extremity may last from 3-48 hours after placement. The length of time depends on the medication injected and your individual response to the medication. If the numbness is not going away after 48 hours, call your surgeon. ° °2. The extremity that is blocked will need to be protected until the numbness is gone and the  Strength has returned. Because you cannot feel it, you will need to take extra care to avoid injury. Because it may be weak, you may have difficulty moving it or using it. You may not know what position it is in without looking at it while the block is in effect. ° °3. For blocks in the legs and feet, returning to weight bearing and walking needs to be done carefully. You will need to wait until the numbness is entirely gone and the strength has returned. You should be able to move your leg and foot normally before you try and bear weight or walk. You will need someone to be with you when you first try to ensure you do not fall and possibly risk injury. ° °4. Bruising and tenderness at the needle site are common side effects and will resolve in a few days. ° °5. Persistent numbness or new problems with movement should be communicated to the surgeon or the [facility] ([phone])/ [facility] ([phone]). ° ° ° °Post Anesthesia Home Care Instructions ° °Activity: °Get plenty of rest for the remainder of the day. A responsible adult should stay with you for 24 hours following the procedure.  °For the next 24 hours, DO NOT: °-Drive a car °-Operate machinery °-Drink alcoholic beverages °-Take any medication unless instructed by your physician °-Make any legal decisions or sign important papers. ° °Meals: °Start with liquid foods such as gelatin or soup. Progress to regular foods as tolerated. Avoid greasy, spicy, heavy foods. If nausea and/or vomiting occur, drink only clear liquids until  the nausea and/or vomiting subsides. Call your physician if vomiting continues. ° °Special Instructions/Symptoms: °Your throat may feel dry or sore from the anesthesia or the breathing tube placed in your throat during surgery. If this causes discomfort, gargle with warm salt water. The discomfort should disappear within 24 hours. ° °If you had a scopolamine patch placed behind your ear for the management of post- operative nausea and/or vomiting: ° °1. The medication in the patch is effective for 72 hours, after which it should be removed.  Wrap patch in a tissue and discard in the trash. Wash hands thoroughly with soap and water. °2. You may remove the patch earlier than 72 hours if you experience unpleasant side effects which may include dry mouth, dizziness or visual disturbances. °3. Avoid touching the patch. Wash your hands with soap and water after contact with the patch. °  ° °

## 2015-06-03 NOTE — Transfer of Care (Signed)
Immediate Anesthesia Transfer of Care Note  Patient: Ryan Jennings  Procedure(s) Performed: Procedure(s): RIGHT DISTAL BICEPS TENDON REPAIR (Right)  Patient Location: PACU  Anesthesia Type:GA combined with regional for post-op pain  Level of Consciousness: awake, alert  and patient cooperative  Airway & Oxygen Therapy: Patient Spontanous Breathing and Patient connected to face mask oxygen  Post-op Assessment: Report given to RN, Post -op Vital signs reviewed and stable and Patient moving all extremities  Post vital signs: Reviewed and stable  Last Vitals:  Filed Vitals:   06/03/15 1039  BP:   Pulse: 63  Temp:   Resp: 12    Complications: No apparent anesthesia complications

## 2015-06-04 ENCOUNTER — Encounter (HOSPITAL_BASED_OUTPATIENT_CLINIC_OR_DEPARTMENT_OTHER): Payer: Self-pay | Admitting: Orthopedic Surgery

## 2015-06-04 NOTE — Op Note (Signed)
NAMEKALIX, MEINECKE               ACCOUNT NO.:  [id number]  MEDICAL RECORD NO.:  [id number]  LOCATION:                               FACILITY:  [facility]  PHYSICIAN:  Orian Amberg A. Noemi Chapel, M.D. DATE OF BIRTH:  October 12, 1960  DATE OF PROCEDURE:  06/03/2015 DATE OF DISCHARGE:  06/03/2015                              OPERATIVE REPORT   PREOPERATIVE DIAGNOSIS:  Right elbow acute traumatic distal biceps tendon rupture.  POSTOPERATIVE DIAGNOSIS:  Right elbow acute traumatic distal biceps tendon rupture.  PROCEDURE:  Right elbow distal biceps tendon rupture repair.  SURGEON:  Audree Camel. Noemi Chapel, M.D.  ASSISTANT:  Kirstin Shepperson, PA-C.  ANESTHESIA:  General.  OPERATIVE TIME:  1 hour.  COMPLICATIONS:  None.  INDICATION FOR PROCEDURE:  Ms. Eastman is a 54 year old gentleman who sustained an acute right elbow distal biceps tendon rupture approximately 6 weeks ago.  Exam and MRI have revealed this as well.  He is now to undergo primary repair.  DESCRIPTION:  Mr. Shill was brought into the operating room on June 03, 2015, after an interscalene block was placed in the holding room by Anesthesia.  He was placed on the table in supine position.  He received antibiotics preoperatively for prophylaxis. After being placed under general anesthesia, his right arm was examined. He had full range of motion.  His elbow was stable to ligamentous exam. His right arm was then prepped using sterile DuraPrep and draped using sterile technique.  Time-out procedure was called and the correct right arm identified.  Arm was exsanguinated and a tourniquet elevated to 250 mm.  Initially through a 4-cm curvilinear antecubital incision, initial exposure was made, the underlying subcutaneous tissues were incised along with skin incision.  Neurovascular structures were carefully protected while the biceps tendon rupture was exposed.  It was found to have a complete rupture of the distal biceps tendon.  The  biceps tendon ends were freshened and then #2 FiberWire suture was weaved through the distal tendon for the repair.  The bicipital tuberosity of the proximal radius was exposed and confirmed with fluoroscopy and then a guide pin from the [employer] TightRope set was used and then drilled into position and then overdrilled in one of cortex with a 7-mm drill.  At this point, then the TightRope was deployed with excellent fixation on the far cortex and confirmed with fluoroscopy and then the tendon was delivered into the tunnel with the elbow in 60 degrees of flexion.  This gave excellent fixation and then the suture from this TightRope was tied again around the tendon, thus locking it in place.  At this point, there was found to be excellent fixation with range of motion from -20 before an excessive tension was noted on the repair.  At this point, then the wound was irrigated, tourniquet was released.  Small venous bleeders were cauterized and then the subcutaneous tissues were closed with 2-0 Vicryl, subcuticular layer closed with 4-0 Monocryl, sterile dressings and a long-arm splint applied, and then the patient awakened and taken to the recovery in stable condition.  Needle and sponge counts correct x2 at the end of the case.  FOLLOWUP CARE:  Mr. Reth will be  followed as an outpatient, on oxycodone and Valium.  Seen back in the office in a week for recheck and followup.     Chelsei Mcchesney A. Noemi Chapel, M.D.     RAW/MEDQ  D:  06/03/2015  T:  06/03/2015  Job:  [id number]

## 2015-06-16 NOTE — Anesthesia Postprocedure Evaluation (Signed)
Anesthesia Post Note  Patient: Ryan Jennings  Procedure(s) Performed: Procedure(s) (LRB): RIGHT DISTAL BICEPS TENDON REPAIR (Right)  Anesthesia type: General  Patient location: PACU  Post pain: Pain level controlled and Adequate analgesia  Post assessment: Post-op Vital signs reviewed, Patient's Cardiovascular Status Stable, Respiratory Function Stable, Patent Airway and Pain level controlled  Last Vitals:  Filed Vitals:   06/03/15 1500  BP: 136/73  Pulse: 57  Temp: 36.5 C  Resp: 16    Post vital signs: Reviewed and stable  Level of consciousness: awake, alert  and oriented  Complications: No apparent anesthesia complications

## 2015-07-22 ENCOUNTER — Ambulatory Visit: Payer: Commercial Managed Care - HMO | Attending: Orthopedic Surgery | Admitting: Physical Therapy

## 2015-07-22 DIAGNOSIS — M25522 Pain in left elbow: Secondary | ICD-10-CM

## 2015-07-22 DIAGNOSIS — R5381 Other malaise: Secondary | ICD-10-CM | POA: Insufficient documentation

## 2015-07-22 DIAGNOSIS — M25621 Stiffness of right elbow, not elsewhere classified: Secondary | ICD-10-CM | POA: Diagnosis present

## 2015-07-22 NOTE — Therapy (Signed)
[facility] [place], [place], [address] Phone: [phone]   Fax:  [phone]  Physical Therapy Evaluation  Patient Details  Name: Ryan Jennings MRN: [id number] Date of Birth: 10-01-1960 Referring Provider: Elsie Saas MD.  Encounter Date: 07/22/2015    Past Medical History  Diagnosis Date  . GERD (gastroesophageal reflux disease)   . Nephrolithiasis   . Esophageal ring     2010, 2012  . Adenomatous colon polyp 2010  . Hyperlipidemia   . PONV (postoperative nausea and vomiting)   . Traumatic rupture of right distal biceps tendon   . PONV (postoperative nausea and vomiting)     Past Surgical History  Procedure Laterality Date  . Appendectomy  8/11  . Rotator cuff repair      right  . Hernia repair      x 3  . Biceps tendon repair    . Esophagogastroduodenoscopy  04/2009    distal erosive reflux esophagitis, schatzki ring s/p 51F, hh, couple of antral erosions  . Colonoscopy  04/2009    sigmoid adenomatous polyp, next colonoscopy 04/2014  . Esophagogastroduodenoscopy  05/2011    Dr. [name]: erosive reflux esophagitis, superimposed on Schatzki's ring and early stricture s/p 32 F, small hiatal hernia  . Colonoscopy N/A 03/24/2014    Procedure: COLONOSCOPY;  Surgeon: [name], MD;  [facility];  Service: Endoscopy;  Laterality: N/A;  1:45-moved to [place] notified pt  . Distal biceps tendon repair Right 06/03/2015    Procedure: RIGHT DISTAL BICEPS TENDON REPAIR;  Surgeon: Elsie Saas, MD;  Location: [facility];  Service: Orthopedics;  Laterality: Right;    There were no vitals filed for this visit.  Visit Diagnosis:  Left elbow pain - Plan: PT plan of care cert/re-cert  Decreased range of motion of elbow, right - Plan: PT plan of care cert/re-cert  Debility - Plan: PT plan of care cert/re-cert      Subjective Assessment - 07/22/15 1245    Subjective Worried i might  have hurt my elbow in my sleep.   Patient Stated Goals regain use of right elbow.   Currently in Pain? Yes   Pain Score 2    Pain Location Elbow   Pain Orientation Right   Pain Descriptors / Indicators Sore   Pain Type --  Sub-acute.   Pain Onset More than a month ago   Pain Frequency Constant   Aggravating Factors  Movement.   Pain Relieving Factors Rest.            [facility] PT Assessment - 07/22/15 0001    Assessment   Medical Diagnosis Right distal biceps tendon repair.   Referring Provider Elsie Saas MD.   Onset Date/Surgical Date --  06/03/15 (surgery date).   Precautions   Precautions --  Follow protocol.   Balance Screen   Has the patient fallen in the past 6 months No   Has the patient had a decrease in activity level because of a fear of falling?  No   Is the patient reluctant to leave their home because of a fear of falling?  No   Home Environment   Living Environment Private residence   Prior Function   Level of Independence Independent   Observation/Other Assessments-Edema    Edema Circumferential   Circumferential Edema   Circumferential - Right Right palm 24 cm.   ROM / Strength   AROM / PROM / Strength PROM   PROM  Overall PROM Comments Full right shoulder ROM.  Right passive wrist flex/ext= 45 degrees; right forearm supination= 28 degrees and elbow brought to -30 degrees of extension and flexion to 115 degrees.  atient unableto make a full fist and lacking fingertips to palm by 2-3 cms.   Palpation   Palpation comment mild palable tenderness over distal 1/2 of right biceps and incisional site.                   [facility] Adult PT Treatment/Exercise - 07/22/15 0001    Manual Therapy   Manual therapy comments Gentle PROM to right elbow and forearm (to -30 degrees of left elbow) x 15 minutes.                     PT Long Term Goals - 07/22/15 1256    PT LONG TERM GOAL #1   Title Ind with HEP.   Time 6   Period Weeks   Status  New   PT LONG TERM GOAL #2   Title Full right forearm supination.   Time 6   Period Weeks   Status New   PT LONG TERM GOAL #3   Title Full right elbow extension.   Time 6   Period Weeks   Status New   PT LONG TERM GOAL #4   Title Perform ADL's with pain not > 2-3/10.   PT LONG TERM GOAL #5   Title Reduce right hand edema by 2 cms.   Time 6   Period Weeks   Status New               Plan - 07/22/15 1248    Clinical Impression Statement The atient underwent a right elbow distal biceps repair on 06/03/15.  His resting pain-level is a low 2/10 today.  His right elbow brace is limited to -30 degrees of extension currently.  His right hand is swollen.  He was intructed in elevation and retrograde massage today.  Recommended an edema glove.   Pt will benefit from skilled therapeutic intervention in order to improve on the following deficits Pain;Decreased activity tolerance;Increased edema;Decreased range of motion   PT Frequency 2x / week   PT Duration 8 weeks   PT Treatment/Interventions ADLs/Self Care Home Management;Cryotherapy;Electrical Stimulation;Moist Heat;Ultrasound;Therapeutic activities;Therapeutic exercise;Manual techniques;Passive range of motion   PT Next Visit Plan Per protocol: Patient would also benefit from sTW/M to his right biceps and retrograde massage to his right hand.   Consulted and Agree with Plan of Care Patient         Problem List Patient Active Problem List   Diagnosis Date Noted  . Traumatic rupture of right distal biceps tendon   . PONV (postoperative nausea and vomiting)   . Adenomatous colon polyp 03/17/2014  . Esophageal dysphagia 05/06/2011  . GERD (gastroesophageal reflux disease) 05/06/2011    Loukas Antonson, [place] 07/22/2015, 1:03 PM  [facility] [address] [place], [place], [address] Phone: [phone]   Fax:  [phone]  Name: Ryan Jennings MRN: [id number] Date of Birth:  09/26/1960

## 2015-07-24 ENCOUNTER — Ambulatory Visit: Payer: Commercial Managed Care - HMO | Admitting: Physical Therapy

## 2015-07-24 DIAGNOSIS — M25522 Pain in left elbow: Secondary | ICD-10-CM | POA: Diagnosis not present

## 2015-07-24 DIAGNOSIS — M25621 Stiffness of right elbow, not elsewhere classified: Secondary | ICD-10-CM

## 2015-07-24 DIAGNOSIS — R5381 Other malaise: Secondary | ICD-10-CM

## 2015-07-24 NOTE — Therapy (Signed)
[facility] [place], [place], [address] Phone: [phone]   Fax:  [phone]  Physical Therapy Treatment  Patient Details  Name: Ryan Jennings MRN: [id number] Date of Birth: 1961-04-13 Referring Provider: Elsie Saas MD.  Encounter Date: 07/24/2015      PT End of Session - 07/24/15 1133    Visit Number 2   Number of Visits 16   Date for PT Re-Evaluation 09/16/15   PT Start Time [id number]   PT Stop Time 1126   PT Time Calculation (min) 46 min      Past Medical History  Diagnosis Date  . GERD (gastroesophageal reflux disease)   . Nephrolithiasis   . Esophageal ring     2010, 2012  . Adenomatous colon polyp 2010  . Hyperlipidemia   . PONV (postoperative nausea and vomiting)   . Traumatic rupture of right distal biceps tendon   . PONV (postoperative nausea and vomiting)     Past Surgical History  Procedure Laterality Date  . Appendectomy  8/11  . Rotator cuff repair      right  . Hernia repair      x 3  . Biceps tendon repair    . Esophagogastroduodenoscopy  04/2009    distal erosive reflux esophagitis, schatzki ring s/p 49F, hh, couple of antral erosions  . Colonoscopy  04/2009    sigmoid adenomatous polyp, next colonoscopy 04/2014  . Esophagogastroduodenoscopy  05/2011    Dr. [name]: erosive reflux esophagitis, superimposed on Schatzki's ring and early stricture s/p 32 F, small hiatal hernia  . Colonoscopy N/A 03/24/2014    Procedure: COLONOSCOPY;  Surgeon: [name], MD;  [facility];  Service: Endoscopy;  Laterality: N/A;  1:45-moved to [place] notified pt  . Distal biceps tendon repair Right 06/03/2015    Procedure: RIGHT DISTAL BICEPS TENDON REPAIR;  Surgeon: Elsie Saas, MD;  Location: [facility];  Service: Orthopedics;  Laterality: Right;    There were no vitals filed for this visit.  Visit Diagnosis:  Left elbow pain  Decreased range of motion of elbow,  right  Debility                       [facility] Adult PT Treatment/Exercise - 07/24/15 0001    Manual Therapy   Manual therapy comments STW/M to patient's right Biceps and incisional site while receiving gentle PROM to patient's left elbow into extension and right forearm supination x 38 minutes.                     PT Long Term Goals - 07/22/15 1256    PT LONG TERM GOAL #1   Title Ind with HEP.   Time 6   Period Weeks   Status New   PT LONG TERM GOAL #2   Title Full right forearm supination.   Time 6   Period Weeks   Status New   PT LONG TERM GOAL #3   Title Full right elbow extension.   Time 6   Period Weeks   Status New   PT LONG TERM GOAL #4   Title Perform ADL's with pain not > 2-3/10.   PT LONG TERM GOAL #5   Title Reduce right hand edema by 2 cms.   Time 6   Period Weeks   Status New               Problem List Patient  Active Problem List   Diagnosis Date Noted  . Traumatic rupture of right distal biceps tendon   . PONV (postoperative nausea and vomiting)   . Adenomatous colon polyp 03/17/2014  . Esophageal dysphagia 05/06/2011  . GERD (gastroesophageal reflux disease) 05/06/2011    [name], [place]  MPT  07/24/2015, 12:55 PM  [facility] [place], [place], [address] Phone: [phone]   Fax:  [phone]  Name: [name] MRN: [id number] Date of Birth: April 16, 1961

## 2015-07-27 ENCOUNTER — Ambulatory Visit: Payer: Commercial Managed Care - HMO | Admitting: *Deleted

## 2015-07-27 ENCOUNTER — Encounter: Payer: Self-pay | Admitting: *Deleted

## 2015-07-27 DIAGNOSIS — R5381 Other malaise: Secondary | ICD-10-CM

## 2015-07-27 DIAGNOSIS — M25522 Pain in left elbow: Secondary | ICD-10-CM

## 2015-07-27 DIAGNOSIS — M25621 Stiffness of right elbow, not elsewhere classified: Secondary | ICD-10-CM

## 2015-07-27 NOTE — Therapy (Signed)
[facility] [place], [place], [address] Phone: [phone]   Fax:  [phone]  Physical Therapy Treatment  Patient Details  Name: Ryan Jennings MRN: [id number] Date of Birth: [birth year] Referring Provider: Elsie Saas MD.  Encounter Date: 07/27/2015      PT End of Session - 07/27/15 1309    Visit Number 3   Number of Visits 16   Date for PT Re-Evaluation 09/16/15   PT Start Time 0900   PT Stop Time 0948   PT Time Calculation (min) 48 min      Past Medical History  Diagnosis Date  . GERD (gastroesophageal reflux disease)   . Nephrolithiasis   . Esophageal ring     2010, 2012  . Adenomatous colon polyp 2010  . Hyperlipidemia   . PONV (postoperative nausea and vomiting)   . Traumatic rupture of right distal biceps tendon   . PONV (postoperative nausea and vomiting)     Past Surgical History  Procedure Laterality Date  . Appendectomy  8/11  . Rotator cuff repair      right  . Hernia repair      x 3  . Biceps tendon repair    . Esophagogastroduodenoscopy  04/2009    distal erosive reflux esophagitis, schatzki ring s/p 59F, hh, couple of antral erosions  . Colonoscopy  04/2009    sigmoid adenomatous polyp, next colonoscopy 04/2014  . Esophagogastroduodenoscopy  05/2011    Dr. [name]: erosive reflux esophagitis, superimposed on Schatzki's ring and early stricture s/p 65 F, small hiatal hernia  . Colonoscopy N/A 03/24/2014    Procedure: COLONOSCOPY;  Surgeon: [name], MD;  [facility];  Service: Endoscopy;  Laterality: N/A;  1:45-moved to [place] notified pt  . Distal biceps tendon repair Right 06/03/2015    Procedure: RIGHT DISTAL BICEPS TENDON REPAIR;  Surgeon: Elsie Saas, MD;  Location: [place];  Service: Orthopedics;  Laterality: Right;    There were no vitals filed for this visit.  Visit Diagnosis:  Decreased range of motion of elbow, right  Left elbow  pain  Debility      Subjective Assessment - 07/27/15 0918    Subjective Worried i might have hurt my elbow in my sleep. To MD on 08-03-15   Patient Stated Goals regain use of right elbow.   Currently in Pain? Yes   Pain Score 2    Pain Location Elbow   Pain Orientation Right   Pain Descriptors / Indicators Sore   Pain Onset More than a month ago   Pain Frequency Constant   Aggravating Factors  movement   Pain Relieving Factors rest                         [facility] Adult PT Treatment/Exercise - 07/27/15 0001    Exercises   Exercises --   Manual Therapy   Manual therapy comments STW/M to patient's right Biceps and incisional site while receiving gentle PROM to patient's left elbow into extension and right forearm supination .Shldr ER/IR isometrics                      PT Long Term Goals - 07/22/15 1256    PT LONG TERM GOAL #1   Title Ind with HEP.   Time 6   Period Weeks   Status New   PT LONG TERM GOAL #2   Title Full right  forearm supination.   Time 6   Period Weeks   Status New   PT LONG TERM GOAL #3   Title Full right elbow extension.   Time 6   Period Weeks   Status New   PT LONG TERM GOAL #4   Title Perform ADL's with pain not > 2-3/10.   PT LONG TERM GOAL #5   Title Reduce right hand edema by 2 cms.   Time 6   Period Weeks   Status New               Plan - 07/27/15 1314    Pt will benefit from skilled therapeutic intervention in order to improve on the following deficits Pain;Decreased activity tolerance;Increased edema;Decreased range of motion   PT Frequency 2x / week   PT Duration 8 weeks   PT Treatment/Interventions ADLs/Self Care Home Management;Cryotherapy;Electrical Stimulation;Moist Heat;Ultrasound;Therapeutic activities;Therapeutic exercise;Manual techniques;Passive range of motion   PT Next Visit Plan Per protocol: Patient would also benefit from sTW/M to his right biceps and retrograde massage to his right  hand.    See protocol   Consulted and Agree with Plan of Care Patient        Problem List Patient Active Problem List   Diagnosis Date Noted  . Traumatic rupture of right distal biceps tendon   . PONV (postoperative nausea and vomiting)   . Adenomatous colon polyp 03/17/2014  . Esophageal dysphagia 05/06/2011  . GERD (gastroesophageal reflux disease) 05/06/2011    RAMSEUR,CHRIS, PTA 07/27/2015, 1:47 PM  [facility] [place], [place], [address] Phone: [phone]   Fax:  [phone]  Name: Ryan Jennings MRN: [id number] Date of Birth: 07-Dec-1960

## 2015-07-29 ENCOUNTER — Ambulatory Visit: Payer: Commercial Managed Care - HMO | Admitting: Physical Therapy

## 2015-07-29 DIAGNOSIS — R5381 Other malaise: Secondary | ICD-10-CM

## 2015-07-29 DIAGNOSIS — M25522 Pain in left elbow: Secondary | ICD-10-CM | POA: Diagnosis not present

## 2015-07-29 DIAGNOSIS — M25621 Stiffness of right elbow, not elsewhere classified: Secondary | ICD-10-CM

## 2015-07-29 NOTE — Therapy (Signed)
[facility] [place], [place], [address] Phone: [phone]   Fax:  [phone]  Physical Therapy Treatment  Patient Details  Name: Ryan Jennings MRN: [id number] Date of Birth: 03/01/1961 Referring Provider: Elsie Saas MD.  Encounter Date: 07/29/2015      PT End of Session - 07/29/15 0902    Visit Number 4   Number of Visits 16   Date for PT Re-Evaluation 09/16/15   PT Start Time 0901   PT Stop Time 0941   PT Time Calculation (min) 40 min   Activity Tolerance Patient tolerated treatment well   Behavior During Therapy [facility] for tasks assessed/performed      Past Medical History  Diagnosis Date  . GERD (gastroesophageal reflux disease)   . Nephrolithiasis   . Esophageal ring     2010, 2012  . Adenomatous colon polyp 2010  . Hyperlipidemia   . PONV (postoperative nausea and vomiting)   . Traumatic rupture of right distal biceps tendon   . PONV (postoperative nausea and vomiting)     Past Surgical History  Procedure Laterality Date  . Appendectomy  8/11  . Rotator cuff repair      right  . Hernia repair      x 3  . Biceps tendon repair    . Esophagogastroduodenoscopy  04/2009    distal erosive reflux esophagitis, schatzki ring s/p 50F, hh, couple of antral erosions  . Colonoscopy  04/2009    sigmoid adenomatous polyp, next colonoscopy 04/2014  . Esophagogastroduodenoscopy  05/2011    Dr. [name]: erosive reflux esophagitis, superimposed on Schatzki's ring and early stricture s/p 40 F, small hiatal hernia  . Colonoscopy N/A 03/24/2014    Procedure: COLONOSCOPY;  Surgeon: [name], MD;  [facility];  Service: Endoscopy;  Laterality: N/A;  1:45-moved to [place] notified pt  . Distal biceps tendon repair Right 06/03/2015    Procedure: RIGHT DISTAL BICEPS TENDON REPAIR;  Surgeon: Elsie Saas, MD;  Location: [place];  Service: Orthopedics;  Laterality: Right;    There were  no vitals filed for this visit.  Visit Diagnosis:  Decreased range of motion of elbow, right  Debility      Subjective Assessment - 07/29/15 0902    Subjective Patient reports just some discomfort.   Patient Stated Goals regain use of right elbow.   Currently in Pain? Yes   Pain Score 1    Pain Location Elbow   Pain Orientation Right   Pain Descriptors / Indicators Sore   Pain Type Surgical pain   Pain Onset More than a month ago   Pain Frequency Constant   Aggravating Factors  movement   Pain Relieving Factors rest   Effect of Pain on Daily Activities unable to use arm            [facility] PT Assessment - 07/29/15 0001    PROM   Overall PROM Comments R elbow ext 0, flex 128 deg; R wrist A/PROM  flex 45/55 deg; ext 34/67 deg                     [facility] Adult PT Treatment/Exercise - 07/29/15 0001    Exercises   Exercises Elbow   Elbow Exercises   Other elbow exercises isometrics with brace R IR/ER/ABD/ ext     Manual Therapy   Manual therapy comments STW/M to patient's right Biceps and incisional site while receiving gentle PROM to  patient's R elbow into extension and right forearm supination                      PT Long Term Goals - 07/29/15 1049    PT LONG TERM GOAL #1   Title Ind with HEP.   Time 6   Period Weeks   Status On-going   PT LONG TERM GOAL #2   Title Full right forearm supination.   Time 6   Period Weeks   Status On-going   PT LONG TERM GOAL #3   Title Full right elbow extension.   Time 6   Period Weeks   Status On-going   PT LONG TERM GOAL #4   Title Perform ADL's with pain not > 2-3/10.   Time 6   Period Weeks   Status On-going   PT LONG TERM GOAL #5   Title Reduce right hand edema by 2 cms.   Time 6   Period Weeks   Status On-going               Plan - 07/29/15 0946    Clinical Impression Statement Patient tolerated therapy well today. He has full PROM into extension after manual therapy and reports  mild discomfort overall. He also has limitations in wrist ROM and continued edema in R hand which he is self treating retromassage and active wrist motion.    Pt will benefit from skilled therapeutic intervention in order to improve on the following deficits Pain;Decreased activity tolerance;Increased edema;Decreased range of motion   Rehab Potential Excellent   PT Frequency 2x / week   PT Duration 8 weeks   PT Treatment/Interventions ADLs/Self Care Home Management;Cryotherapy;Electrical Stimulation;Moist Heat;Ultrasound;Therapeutic activities;Therapeutic exercise;Manual techniques;Passive range of motion   PT Next Visit Plan Per protocol: Patient would also benefit from sTW/M to his right biceps and retrograde massage to his right hand.   MD note sent 07/29/15 for 08/03/15 appt.   PT Home Exercise Plan isometrics shoulder IR/ER/ABD/ext   Consulted and Agree with Plan of Care Patient        Problem List Patient Active Problem List   Diagnosis Date Noted  . Traumatic rupture of right distal biceps tendon   . PONV (postoperative nausea and vomiting)   . Adenomatous colon polyp 03/17/2014  . Esophageal dysphagia 05/06/2011  . GERD (gastroesophageal reflux disease) 05/06/2011    Madelyn Flavors PT  07/29/2015, 10:55 AM  [facility] [address] [place], [place], [address] Phone: [phone]   Fax:  [phone]  Name: Ryan Jennings MRN: [id number] Date of Birth: September 14, 1960

## 2015-08-03 ENCOUNTER — Ambulatory Visit: Payer: Commercial Managed Care - HMO | Admitting: Physical Therapy

## 2015-08-03 DIAGNOSIS — M25621 Stiffness of right elbow, not elsewhere classified: Secondary | ICD-10-CM

## 2015-08-03 DIAGNOSIS — R5381 Other malaise: Secondary | ICD-10-CM

## 2015-08-03 DIAGNOSIS — M25522 Pain in left elbow: Secondary | ICD-10-CM | POA: Diagnosis not present

## 2015-08-03 NOTE — Therapy (Signed)
[facility] [place], [place], [address] Phone: [phone]   Fax:  [phone]  Physical Therapy Treatment  Patient Details  Name: GLENDALE OPENSHAW MRN: [id number] Date of Birth: 01/11/1961 Referring Provider: Elsie Saas MD.  Encounter Date: 08/03/2015      PT End of Session - 08/03/15 1031    Visit Number 5   Number of Visits 16   Date for PT Re-Evaluation 09/16/15   PT Start Time 1031   PT Stop Time 1114   PT Time Calculation (min) 43 min   Activity Tolerance Patient tolerated treatment well   Behavior During Therapy [facility] for tasks assessed/performed      Past Medical History  Diagnosis Date  . GERD (gastroesophageal reflux disease)   . Nephrolithiasis   . Esophageal ring     2010, 2012  . Adenomatous colon polyp 2010  . Hyperlipidemia   . PONV (postoperative nausea and vomiting)   . Traumatic rupture of right distal biceps tendon   . PONV (postoperative nausea and vomiting)     Past Surgical History  Procedure Laterality Date  . Appendectomy  8/11  . Rotator cuff repair      right  . Hernia repair      x 3  . Biceps tendon repair    . Esophagogastroduodenoscopy  04/2009    distal erosive reflux esophagitis, schatzki ring s/p 71F, hh, couple of antral erosions  . Colonoscopy  04/2009    sigmoid adenomatous polyp, next colonoscopy 04/2014  . Esophagogastroduodenoscopy  05/2011    Dr. [name]: erosive reflux esophagitis, superimposed on Schatzki's ring and early stricture s/p 61 F, small hiatal hernia  . Colonoscopy N/A 03/24/2014    Procedure: COLONOSCOPY;  Surgeon: [name], MD;  [facility];  Service: Endoscopy;  Laterality: N/A;  1:45-moved to [place] notified pt  . Distal biceps tendon repair Right 06/03/2015    Procedure: RIGHT DISTAL BICEPS TENDON REPAIR;  Surgeon: Elsie Saas, MD;  Location: [place];  Service: Orthopedics;  Laterality: Right;    There were  no vitals filed for this visit.  Visit Diagnosis:  Decreased range of motion of elbow, right  Debility      Subjective Assessment - 08/03/15 1032    Subjective Just a little discomfort. Decreased swelling in hand. Hasn't slept in brace in two days which has helped.   Patient Stated Goals regain use of right elbow.   Currently in Pain? Yes   Pain Score 1   0-1   Pain Location Elbow   Pain Orientation Right   Pain Descriptors / Indicators Sore   Pain Type Surgical pain   Pain Onset More than a month ago                         [facility] Adult PT Treatment/Exercise - 08/03/15 0001    Manual Therapy   Manual Therapy Edema management;Passive ROM   Manual therapy comments STW/M to patient's right Biceps and incisional site while receiving gentle PROM to patient's R elbow into extension and right forearm supination    Edema Management retromassage to R hand and distal UE   Passive ROM R wrist and elbow                     PT Long Term Goals - 07/29/15 1049    PT LONG TERM GOAL #1   Title Ind with HEP.  Time 6   Period Weeks   Status On-going   PT LONG TERM GOAL #2   Title Full right forearm supination.   Time 6   Period Weeks   Status On-going   PT LONG TERM GOAL #3   Title Full right elbow extension.   Time 6   Period Weeks   Status On-going   PT LONG TERM GOAL #4   Title Perform ADL's with pain not > 2-3/10.   Time 6   Period Weeks   Status On-going   PT LONG TERM GOAL #5   Title Reduce right hand edema by 2 cms.   Time 6   Period Weeks   Status On-going               Plan - 08/03/15 1121    Clinical Impression Statement Patient continues to tolerate therapy well. He has significant tightness in R wrist flexors, but edema is markedly reduced. Still present mainly in R thumb.    Pt will benefit from skilled therapeutic intervention in order to improve on the following deficits Pain;Decreased activity tolerance;Increased  edema;Decreased range of motion   Rehab Potential Excellent   PT Frequency 2x / week   PT Duration 8 weeks   PT Treatment/Interventions ADLs/Self Care Home Management;Cryotherapy;Electrical Stimulation;Moist Heat;Ultrasound;Therapeutic activities;Therapeutic exercise;Manual techniques;Passive range of motion   PT Next Visit Plan Advance per protocol; pt had MD appt 08/03/15.    Consulted and Agree with Plan of Care Patient        Problem List Patient Active Problem List   Diagnosis Date Noted  . Traumatic rupture of right distal biceps tendon   . PONV (postoperative nausea and vomiting)   . Adenomatous colon polyp 03/17/2014  . Esophageal dysphagia 05/06/2011  . GERD (gastroesophageal reflux disease) 05/06/2011   Madelyn Flavors PT  08/03/2015, 11:46 AM  [facility] [address] [place], [place], [address] Phone: [phone]   Fax:  [phone]  Name: EMRON RECKNER MRN: [id number] Date of Birth: 1961/02/13

## 2015-08-06 ENCOUNTER — Ambulatory Visit: Payer: Commercial Managed Care - HMO | Attending: Orthopedic Surgery | Admitting: *Deleted

## 2015-08-06 ENCOUNTER — Encounter: Payer: Self-pay | Admitting: *Deleted

## 2015-08-06 DIAGNOSIS — M25522 Pain in left elbow: Secondary | ICD-10-CM | POA: Diagnosis present

## 2015-08-06 DIAGNOSIS — M25621 Stiffness of right elbow, not elsewhere classified: Secondary | ICD-10-CM | POA: Insufficient documentation

## 2015-08-06 DIAGNOSIS — R5381 Other malaise: Secondary | ICD-10-CM | POA: Diagnosis present

## 2015-08-06 DIAGNOSIS — M25631 Stiffness of right wrist, not elsewhere classified: Secondary | ICD-10-CM | POA: Diagnosis present

## 2015-08-06 NOTE — Therapy (Signed)
[facility] [place], [place], [address] Phone: [phone]   Fax:  [phone]  Physical Therapy Treatment  Patient Details  Name: Ryan Jennings MRN: [id number] Date of Birth: 18-Oct-1960 Referring Provider: Elsie Saas MD.  Encounter Date: 08/06/2015      PT End of Session - 08/06/15 0941    Visit Number 6   Number of Visits 16   Date for PT Re-Evaluation 09/16/15   PT Start Time 0900   PT Stop Time 0946   PT Time Calculation (min) 46 min      Past Medical History  Diagnosis Date  . GERD (gastroesophageal reflux disease)   . Nephrolithiasis   . Esophageal ring     2010, 2012  . Adenomatous colon polyp 2010  . Hyperlipidemia   . PONV (postoperative nausea and vomiting)   . Traumatic rupture of right distal biceps tendon   . PONV (postoperative nausea and vomiting)     Past Surgical History  Procedure Laterality Date  . Appendectomy  8/11  . Rotator cuff repair      right  . Hernia repair      x 3  . Biceps tendon repair    . Esophagogastroduodenoscopy  04/2009    distal erosive reflux esophagitis, schatzki ring s/p 82F, hh, couple of antral erosions  . Colonoscopy  04/2009    sigmoid adenomatous polyp, next colonoscopy 04/2014  . Esophagogastroduodenoscopy  05/2011    Dr. [name]: erosive reflux esophagitis, superimposed on Schatzki's ring and early stricture s/p 37 F, small hiatal hernia  . Colonoscopy N/A 03/24/2014    Procedure: COLONOSCOPY;  Surgeon: [name], MD;  [facility];  Service: Endoscopy;  Laterality: N/A;  1:45-moved to [place] notified pt  . Distal biceps tendon repair Right 06/03/2015    Procedure: RIGHT DISTAL BICEPS TENDON REPAIR;  Surgeon: Elsie Saas, MD;  Location: [place];  Service: Orthopedics;  Laterality: Right;    There were no vitals filed for this visit.  Visit Diagnosis:  Decreased range of motion of elbow, right  Debility  Left  elbow pain      Subjective Assessment - 08/06/15 0857    Subjective Went to MD and brace was DC.  Follow Protocol.                Scope on Friday for RT knee    Patient Stated Goals regain use of right elbow.   Currently in Pain? Yes   Pain Score 1    Pain Location Elbow   Pain Orientation Right   Pain Descriptors / Indicators Sore   Pain Type Surgical pain   Pain Onset More than a month ago   Pain Frequency Constant   Aggravating Factors  movement   Pain Relieving Factors rest                         [facility] Adult PT Treatment/Exercise - 08/06/15 0001    Exercises   Exercises Elbow   Elbow Exercises   Forearm Supination AROM;20 reps  1# wt resting on pillow for ROM   Forearm Pronation AROM;20 reps  1# wt for ROM   Other elbow exercises isometrics  R IR/ER/ABD/ ext          Other elbow exercises flexion and extension 3x10, supination and pronation x30   Manual Therapy   Manual Therapy Edema management;Passive ROM   Manual therapy comments STW/M  to patient's right Biceps and incisional site while receiving gentle PROM to patient's R elbow into extension and right forearm supination / Pronation   Passive ROM R wrist and elbow                                                                                                    AROM for sup/pron. Done with elbow straight and bent                 PT Long Term Goals - 07/29/15 1049    PT LONG TERM GOAL #1   Title Ind with HEP.   Time 6   Period Weeks   Status On-going   PT LONG TERM GOAL #2   Title Full right forearm supination.   Time 6   Period Weeks   Status On-going   PT LONG TERM GOAL #3   Title Full right elbow extension.   Time 6   Period Weeks   Status On-going   PT LONG TERM GOAL #4   Title Perform ADL's with pain not > 2-3/10.   Time 6   Period Weeks   Status On-going   PT LONG TERM GOAL #5   Title Reduce right hand edema by 2 cms.   Time 6   Period Weeks   Status On-going                Plan - 08/06/15 0945    Clinical Impression Statement Pt did fairly well with Rx today.    Pt will benefit from skilled therapeutic intervention in order to improve on the following deficits Pain;Decreased activity tolerance;Increased edema;Decreased range of motion   Rehab Potential Excellent   PT Frequency 2x / week   PT Duration 8 weeks   PT Treatment/Interventions ADLs/Self Care Home Management;Cryotherapy;Electrical Stimulation;Moist Heat;Ultrasound;Therapeutic activities;Therapeutic exercise;Manual techniques;Passive range of motion   PT Next Visit Plan (p) Advance per protocol; pt had MD appt 08/03/15. Cont with Prorocol 8-12 weeks   PT Home Exercise Plan isometrics shoulder IR/ER/ABD/ext   Consulted and Agree with Plan of Care Patient        Problem List Patient Active Problem List   Diagnosis Date Noted  . Traumatic rupture of right distal biceps tendon   . PONV (postoperative nausea and vomiting)   . Adenomatous colon polyp 03/17/2014  . Esophageal dysphagia 05/06/2011  . GERD (gastroesophageal reflux disease) 05/06/2011    RAMSEUR,CHRIS, PTA 08/06/2015, 12:44 PM  [facility] [place], [place], [address] Phone: [phone]   Fax:  [phone]  Name: Ryan Jennings MRN: [id number] Date of Birth: 1961-03-07

## 2015-08-11 ENCOUNTER — Ambulatory Visit: Payer: Commercial Managed Care - HMO | Admitting: *Deleted

## 2015-08-11 ENCOUNTER — Encounter: Payer: Self-pay | Admitting: *Deleted

## 2015-08-11 DIAGNOSIS — M25621 Stiffness of right elbow, not elsewhere classified: Secondary | ICD-10-CM

## 2015-08-11 DIAGNOSIS — M25522 Pain in left elbow: Secondary | ICD-10-CM

## 2015-08-11 DIAGNOSIS — R5381 Other malaise: Secondary | ICD-10-CM

## 2015-08-11 NOTE — Therapy (Signed)
[facility] [place], [place], [address] Phone: [phone]   Fax:  [phone]  Physical Therapy Treatment  Patient Details  Name: Ryan Jennings MRN: [id number] Date of Birth: 1961-03-12 Referring Provider: Elsie Saas MD.  Encounter Date: 08/11/2015      PT End of Session - 08/11/15 0909    Visit Number 7   Number of Visits 16   Date for PT Re-Evaluation 09/16/15   PT Start Time 0900   PT Stop Time 0946   PT Time Calculation (min) 46 min      Past Medical History  Diagnosis Date  . GERD (gastroesophageal reflux disease)   . Nephrolithiasis   . Esophageal ring     2010, 2012  . Adenomatous colon polyp 2010  . Hyperlipidemia   . PONV (postoperative nausea and vomiting)   . Traumatic rupture of right distal biceps tendon   . PONV (postoperative nausea and vomiting)     Past Surgical History  Procedure Laterality Date  . Appendectomy  8/11  . Rotator cuff repair      right  . Hernia repair      x 3  . Biceps tendon repair    . Esophagogastroduodenoscopy  04/2009    distal erosive reflux esophagitis, schatzki ring s/p 44F, hh, couple of antral erosions  . Colonoscopy  04/2009    sigmoid adenomatous polyp, next colonoscopy 04/2014  . Esophagogastroduodenoscopy  05/2011    Dr. [name]: erosive reflux esophagitis, superimposed on Schatzki's ring and early stricture s/p 15 F, small hiatal hernia  . Colonoscopy N/A 03/24/2014    Procedure: COLONOSCOPY;  Surgeon: [name], MD;  [facility];  Service: Endoscopy;  Laterality: N/A;  1:45-moved to [place] notified pt  . Distal biceps tendon repair Right 06/03/2015    Procedure: RIGHT DISTAL BICEPS TENDON REPAIR;  Surgeon: Elsie Saas, MD;  Location: [place];  Service: Orthopedics;  Laterality: Right;    There were no vitals filed for this visit.  Visit Diagnosis:  Decreased range of motion of elbow, right  Debility  Left  elbow pain      Subjective Assessment - 08/11/15 0907    Subjective Went to MD and brace was DC.  Follow Protocol.                Scope on Friday for RT knee   Rt elbow sore   Patient Stated Goals regain use of right elbow.   Currently in Pain? Yes   Pain Score 1    Pain Location Elbow   Pain Orientation Right   Pain Descriptors / Indicators Sore   Pain Type Surgical pain   Pain Onset More than a month ago   Pain Frequency Constant   Aggravating Factors  movement   Pain Relieving Factors rest                         [facility] Adult PT Treatment/Exercise - 08/11/15 0001    Exercises   Exercises Elbow   Elbow Exercises   Forearm Supination AROM;20 reps  1# wt resting on pillow for ROM   Forearm Pronation AROM;20 reps  1# wt for ROM   Wrist Flexion Strengthening;Right;20 reps;Seated  1# wt arm resting on pillow   Other elbow exercises isometrics  R IR/ER/ABD/ ext          Other elbow exercises flexion and extension 3x10, supination and pronation x30  Manual Therapy   Manual Therapy Edema management;Passive ROM   Manual therapy comments STW/M to patient's right Biceps and incisional site while receiving gentle PROM to patient's R elbow into extension and right forearm supination / Pronation. RT shldr isometrics all planes   Passive ROM R wrist Flexion and extension                     PT Long Term Goals - 07/29/15 1049    PT LONG TERM GOAL #1   Title Ind with HEP.   Time 6   Period Weeks   Status On-going   PT LONG TERM GOAL #2   Title Full right forearm supination.   Time 6   Period Weeks   Status On-going   PT LONG TERM GOAL #3   Title Full right elbow extension.   Time 6   Period Weeks   Status On-going   PT LONG TERM GOAL #4   Title Perform ADL's with pain not > 2-3/10.   Time 6   Period Weeks   Status On-going   PT LONG TERM GOAL #5   Title Reduce right hand edema by 2 cms.   Time 6   Period Weeks   Status On-going                Plan - 08/11/15 0909    Clinical Impression Statement Pt did great with RX. He still had some tightness and soreness with AROM AND PROM, but feels that he is doing well. He has the most soreness with PROM for supination.   Pt will benefit from skilled therapeutic intervention in order to improve on the following deficits Pain;Decreased activity tolerance;Increased edema;Decreased range of motion   Rehab Potential Excellent   PT Frequency 2x / week   PT Duration 8 weeks   PT Treatment/Interventions ADLs/Self Care Home Management;Cryotherapy;Electrical Stimulation;Moist Heat;Ultrasound;Therapeutic activities;Therapeutic exercise;Manual techniques;Passive range of motion   PT Next Visit Plan Advance per protocol; pt had MD appt 08/03/15.    PT Home Exercise Plan isometrics shoulder IR/ER/ABD/ext   Consulted and Agree with Plan of Care Patient        Problem List Patient Active Problem List   Diagnosis Date Noted  . Traumatic rupture of right distal biceps tendon   . PONV (postoperative nausea and vomiting)   . Adenomatous colon polyp 03/17/2014  . Esophageal dysphagia 05/06/2011  . GERD (gastroesophageal reflux disease) 05/06/2011    RAMSEUR,CHRIS  , PTA 08/11/2015, 12:27 PM  [facility] [place], [place], [address] Phone: [phone]   Fax:  [phone]  Name: Ryan Jennings MRN: [id number] Date of Birth: 1961-07-13

## 2015-08-13 ENCOUNTER — Ambulatory Visit: Payer: Commercial Managed Care - HMO | Admitting: Physical Therapy

## 2015-08-13 DIAGNOSIS — M25621 Stiffness of right elbow, not elsewhere classified: Secondary | ICD-10-CM

## 2015-08-13 DIAGNOSIS — M25631 Stiffness of right wrist, not elsewhere classified: Secondary | ICD-10-CM

## 2015-08-13 DIAGNOSIS — R5381 Other malaise: Secondary | ICD-10-CM

## 2015-08-13 NOTE — Therapy (Signed)
[facility] [place], [place], [address] Phone: [phone]   Fax:  [phone]  Physical Therapy Treatment  Patient Details  Name: Ryan Jennings MRN: [id number] Date of Birth: 1960-09-24 Referring Provider: Elsie Saas MD.  Encounter Date: 08/13/2015      PT End of Session - 08/13/15 0904    Visit Number 8   Number of Visits 16   Date for PT Re-Evaluation 09/16/15   PT Start Time 0904   PT Stop Time 0945   PT Time Calculation (min) 41 min      Past Medical History  Diagnosis Date  . GERD (gastroesophageal reflux disease)   . Nephrolithiasis   . Esophageal ring     2010, 2012  . Adenomatous colon polyp 2010  . Hyperlipidemia   . PONV (postoperative nausea and vomiting)   . Traumatic rupture of right distal biceps tendon   . PONV (postoperative nausea and vomiting)     Past Surgical History  Procedure Laterality Date  . Appendectomy  8/11  . Rotator cuff repair      right  . Hernia repair      x 3  . Biceps tendon repair    . Esophagogastroduodenoscopy  04/2009    distal erosive reflux esophagitis, schatzki ring s/p 89F, hh, couple of antral erosions  . Colonoscopy  04/2009    sigmoid adenomatous polyp, next colonoscopy 04/2014  . Esophagogastroduodenoscopy  05/2011    Dr. [name]: erosive reflux esophagitis, superimposed on Schatzki's ring and early stricture s/p 25 F, small hiatal hernia  . Colonoscopy N/A 03/24/2014    Procedure: COLONOSCOPY;  Surgeon: [name], MD;  [facility];  Service: Endoscopy;  Laterality: N/A;  1:45-moved to [place] notified pt  . Distal biceps tendon repair Right 06/03/2015    Procedure: RIGHT DISTAL BICEPS TENDON REPAIR;  Surgeon: Elsie Saas, MD;  Location: [place];  Service: Orthopedics;  Laterality: Right;    There were no vitals filed for this visit.  Visit Diagnosis:  Decreased range of motion of elbow, right  Debility  Wrist  stiffness, right      Subjective Assessment - 08/13/15 0904    Subjective The biggest trouble I'm having is my wrist motion.   Patient Stated Goals regain use of right elbow.   Currently in Pain? Yes   Pain Score 1    Pain Location Elbow   Pain Orientation Right   Pain Descriptors / Indicators Sore   Pain Type Surgical pain            [facility] PT Assessment - 08/13/15 0001    Circumferential Edema   Circumferential - Right 22.cm around palm   ROM / Strength   AROM / PROM / Strength PROM;AROM   AROM   AROM Assessment Site Forearm;Wrist;Elbow   Right/Left Elbow Right   Right Elbow Flexion 136   Right Elbow Extension -12   Right/Left Forearm Right   Right Forearm Pronation 70 Degrees   Right Forearm Supination 57 Degrees   Right/Left Wrist Right   Right Wrist Extension 45 Degrees   Right Wrist Flexion 35 Degrees   PROM   PROM Assessment Site Forearm;Elbow;Wrist   Right/Left Elbow Right   Right Elbow Flexion 144   Right Elbow Extension -4   Right/Left Forearm Right   Right Forearm Supination 60 Degrees   Right/Left Wrist Right   Right Wrist Extension 65 Degrees   Right Wrist Flexion 50 Degrees  [facility] Adult PT Treatment/Exercise - 08/13/15 0001    Exercises   Exercises Elbow   Elbow Exercises   Elbow Flexion Strengthening  30 reps with pro/sup combo; 1#   Forearm Supination AROM  on pillow using hammer for longer lever arm x 30   Forearm Pronation AROM  on pillow using hammer for longer lever arm x 30   Manual Therapy   Manual Therapy Joint mobilization;Soft tissue mobilization;Myofascial release;Passive ROM   Joint Mobilization carpals/metacarpals   Soft tissue mobilization R biceps and wrist extensors   Myofascial Release along R forearm   Passive ROM R wrist and elbow                     PT Long Term Goals - 08/13/15 0938    PT LONG TERM GOAL #1   Title Ind with HEP.   Time 6   Period Weeks   Status  On-going   PT LONG TERM GOAL #2   Title Full right forearm supination.   Time 6   Period Weeks   Status On-going   PT LONG TERM GOAL #3   Title Full right elbow extension.   Time 6   Period Weeks   Status On-going   PT LONG TERM GOAL #4   Title Perform ADL's with pain not > 2-3/10.   Time 6   Period Weeks   Status On-going   PT LONG TERM GOAL #5   Title Reduce right hand edema by 2 cms.   Time 6   Period Weeks   Status Achieved               Plan - 08/13/15 0953    Clinical Impression Statement Patient tolerated treatment well today. He gets some soreness with strenghthening. He continues to have marked tightness in post R wrist limiting ROM however he has met reduction in edema goal. He is progressing well with elbow and forearm ROM.   Pt will benefit from skilled therapeutic intervention in order to improve on the following deficits Pain;Decreased activity tolerance;Increased edema;Decreased range of motion   Rehab Potential Excellent   PT Frequency 2x / week   PT Duration 8 weeks   PT Treatment/Interventions ADLs/Self Care Home Management;Cryotherapy;Electrical Stimulation;Moist Heat;Ultrasound;Therapeutic activities;Therapeutic exercise;Manual techniques;Passive range of motion   PT Next Visit Plan continue ROM, strengthening per protocol.   Consulted and Agree with Plan of Care Patient        Problem List Patient Active Problem List   Diagnosis Date Noted  . Traumatic rupture of right distal biceps tendon   . PONV (postoperative nausea and vomiting)   . Adenomatous colon polyp 03/17/2014  . Esophageal dysphagia 05/06/2011  . GERD (gastroesophageal reflux disease) 05/06/2011   Madelyn Flavors PT  08/13/2015, 9:58 AM  [facility] [address] [place], [place], [address] Phone: [phone]   Fax:  [phone]  Name: Ryan Jennings MRN: [id number] Date of Birth: 1960-11-22

## 2015-08-18 ENCOUNTER — Encounter: Payer: 59 | Admitting: Physical Therapy

## 2015-08-21 ENCOUNTER — Ambulatory Visit: Payer: Commercial Managed Care - HMO | Admitting: *Deleted

## 2015-08-21 DIAGNOSIS — M25621 Stiffness of right elbow, not elsewhere classified: Secondary | ICD-10-CM

## 2015-08-21 DIAGNOSIS — M25522 Pain in left elbow: Secondary | ICD-10-CM

## 2015-08-21 DIAGNOSIS — R5381 Other malaise: Secondary | ICD-10-CM

## 2015-08-21 DIAGNOSIS — M25631 Stiffness of right wrist, not elsewhere classified: Secondary | ICD-10-CM

## 2015-08-21 NOTE — Therapy (Signed)
[facility] [place], [place], [address] Phone: [phone]   Fax:  [phone]  Physical Therapy Treatment  Patient Details  Name: Ryan Jennings MRN: [id number] Date of Birth: 07-May-1961 Referring Provider: Elsie Saas MD.  Encounter Date: 08/21/2015      PT End of Session - 08/21/15 1024    Visit Number 9   Number of Visits 16   Date for PT Re-Evaluation 09/16/15   PT Start Time 0945   PT Stop Time 1033   PT Time Calculation (min) 48 min      Past Medical History  Diagnosis Date  . GERD (gastroesophageal reflux disease)   . Nephrolithiasis   . Esophageal ring     2010, 2012  . Adenomatous colon polyp 2010  . Hyperlipidemia   . PONV (postoperative nausea and vomiting)   . Traumatic rupture of right distal biceps tendon   . PONV (postoperative nausea and vomiting)     Past Surgical History  Procedure Laterality Date  . Appendectomy  8/11  . Rotator cuff repair      right  . Hernia repair      x 3  . Biceps tendon repair    . Esophagogastroduodenoscopy  04/2009    distal erosive reflux esophagitis, schatzki ring s/p 7F, hh, couple of antral erosions  . Colonoscopy  04/2009    sigmoid adenomatous polyp, next colonoscopy 04/2014  . Esophagogastroduodenoscopy  05/2011    Dr. [name]: erosive reflux esophagitis, superimposed on Schatzki's ring and early stricture s/p 79 F, small hiatal hernia  . Colonoscopy N/A 03/24/2014    Procedure: COLONOSCOPY;  Surgeon: [name], MD;  [facility];  Service: Endoscopy;  Laterality: N/A;  1:45-moved to [place] notified pt  . Distal biceps tendon repair Right 06/03/2015    Procedure: RIGHT DISTAL BICEPS TENDON REPAIR;  Surgeon: Elsie Saas, MD;  Location: [place];  Service: Orthopedics;  Laterality: Right;    There were no vitals filed for this visit.  Visit Diagnosis:  Decreased range of motion of elbow,  right  Debility  Wrist stiffness, right  Left elbow pain      Subjective Assessment - 08/21/15 0958    Subjective The biggest trouble I'm having is my wrist motion.   Patient Stated Goals regain use of right elbow.   Currently in Pain? Yes   Pain Score 2    Pain Location Elbow   Pain Orientation Right   Pain Descriptors / Indicators Sore   Pain Type Surgical pain   Pain Onset More than a month ago   Pain Frequency Constant   Aggravating Factors  movement   Pain Relieving Factors PT Rx            [facility] PT Assessment - 08/21/15 0001    AROM   AROM Assessment Site Forearm;Wrist   Right/Left Elbow Right   Right Elbow Flexion 140   Right Elbow Extension -4   Right Forearm Pronation 75 Degrees   Right Forearm Supination 70 Degrees   Right Wrist Extension 50 Degrees   Right Wrist Flexion 40 Degrees                     [facility] Adult PT Treatment/Exercise - 08/21/15 0001    Exercises   Exercises Elbow   Elbow Exercises   Elbow Flexion Strengthening  30 reps with pro/sup combo; 1#   Forearm Supination AROM  on pillow using hammer  for longer lever arm x 30   Forearm Pronation AROM  on pillow using hammer for longer lever arm x 30   Wrist Flexion Strengthening;Right;20 reps;Seated;10 reps  2# wt arm resting on pillow   Wrist Extension Strengthening;20 reps;10 reps;Seated  arm resting on pillow, performed 2# radial deviation 3x10   Manual Therapy   Manual Therapy Joint mobilization;Soft tissue mobilization;Myofascial release;Passive ROM   Joint Mobilization carpals/metacarpals   Soft tissue mobilization R biceps and wrist extensors   Myofascial Release along R forearm   Passive ROM R wrist and elbow                     PT Long Term Goals - 08/13/15 [id number]    PT LONG TERM GOAL #1   Title Ind with HEP.   Time 6   Period Weeks   Status On-going   PT LONG TERM GOAL #2   Title Full right forearm supination.   Time 6   Period Weeks   Status  On-going   PT LONG TERM GOAL #3   Title Full right elbow extension.   Time 6   Period Weeks   Status On-going   PT LONG TERM GOAL #4   Title Perform ADL's with pain not > 2-3/10.   Time 6   Period Weeks   Status On-going   PT LONG TERM GOAL #5   Title Reduce right hand edema by 2 cms.   Time 6   Period Weeks   Status Achieved               Plan - 08/21/15 1037    Clinical Impression Statement Pt did great today and was able to complete all exs with minimal pain increase. His ROM continues to progress towards gols, but unable to meet them yet.   Pt will benefit from skilled therapeutic intervention in order to improve on the following deficits Pain;Decreased activity tolerance;Increased edema;Decreased range of motion   Rehab Potential Excellent   PT Frequency 2x / week   PT Duration 8 weeks   PT Treatment/Interventions ADLs/Self Care Home Management;Cryotherapy;Electrical Stimulation;Moist Heat;Ultrasound;Therapeutic activities;Therapeutic exercise;Manual techniques;Passive range of motion   PT Next Visit Plan continue ROM, strengthening per protocol.   PT Home Exercise Plan isometrics shoulder IR/ER/ABD/ext   Consulted and Agree with Plan of Care Patient        Problem List Patient Active Problem List   Diagnosis Date Noted  . Traumatic rupture of right distal biceps tendon   . PONV (postoperative nausea and vomiting)   . Adenomatous colon polyp 03/17/2014  . Esophageal dysphagia 05/06/2011  . GERD (gastroesophageal reflux disease) 05/06/2011    RAMSEUR,CHRIS, PTA 08/21/2015, 10:46 AM  [facility] [place], [place], [address] Phone: [phone]   Fax:  [phone]  Name: Ryan Jennings MRN: [id number] Date of Birth: 11-06-1960

## 2015-08-26 ENCOUNTER — Ambulatory Visit: Payer: Commercial Managed Care - HMO | Admitting: *Deleted

## 2015-08-26 DIAGNOSIS — M25621 Stiffness of right elbow, not elsewhere classified: Secondary | ICD-10-CM

## 2015-08-26 DIAGNOSIS — R5381 Other malaise: Secondary | ICD-10-CM

## 2015-08-26 DIAGNOSIS — M25631 Stiffness of right wrist, not elsewhere classified: Secondary | ICD-10-CM

## 2015-08-26 NOTE — Therapy (Signed)
[facility] [place], [place], [address] Phone: [phone]   Fax:  [phone]  Physical Therapy Treatment  Patient Details  Name: Ryan Jennings MRN: [id number] Date of Birth: 09-Oct-1960 Referring Provider: Elsie Saas MD.  Encounter Date: 08/26/2015      PT End of Session - 08/26/15 0914    Visit Number 10   Number of Visits 16   Date for PT Re-Evaluation 09/16/15   PT Start Time 0900   PT Stop Time 0950   PT Time Calculation (min) 50 min      Past Medical History  Diagnosis Date  . GERD (gastroesophageal reflux disease)   . Nephrolithiasis   . Esophageal ring     2010, 2012  . Adenomatous colon polyp 2010  . Hyperlipidemia   . PONV (postoperative nausea and vomiting)   . Traumatic rupture of right distal biceps tendon   . PONV (postoperative nausea and vomiting)     Past Surgical History  Procedure Laterality Date  . Appendectomy  8/11  . Rotator cuff repair      right  . Hernia repair      x 3  . Biceps tendon repair    . Esophagogastroduodenoscopy  04/2009    distal erosive reflux esophagitis, schatzki ring s/p 62F, hh, couple of antral erosions  . Colonoscopy  04/2009    sigmoid adenomatous polyp, next colonoscopy 04/2014  . Esophagogastroduodenoscopy  05/2011    Dr. [name]: erosive reflux esophagitis, superimposed on Schatzki's ring and early stricture s/p 74 F, small hiatal hernia  . Colonoscopy N/A 03/24/2014    Procedure: COLONOSCOPY;  Surgeon: [name], MD;  [facility];  Service: Endoscopy;  Laterality: N/A;  1:45-moved to [place] notified pt  . Distal biceps tendon repair Right 06/03/2015    Procedure: RIGHT DISTAL BICEPS TENDON REPAIR;  Surgeon: Elsie Saas, MD;  Location: [place];  Service: Orthopedics;  Laterality: Right;    There were no vitals filed for this visit.  Visit Diagnosis:  Decreased range of motion of elbow,  right  Debility  Wrist stiffness, right      Subjective Assessment - 08/26/15 0911    Subjective The biggest trouble I'm having is my wrist motion. A little sore today after using it a little yesterday   Patient Stated Goals regain use of right elbow.   Currently in Pain? Yes   Pain Score 3    Pain Location Elbow   Pain Orientation Right   Pain Descriptors / Indicators Sore   Pain Type Surgical pain   Pain Onset More than a month ago   Pain Frequency Constant   Aggravating Factors  movement   Pain Relieving Factors PT Rx            [facility] PT Assessment - 08/26/15 0001    AROM   AROM Assessment Site Forearm   Right/Left Elbow Right   Right Elbow Flexion 140   Right Elbow Extension 0   Right Forearm Pronation 80 Degrees   Right Forearm Supination 75 Degrees   Right Wrist Extension 55 Degrees   Right Wrist Flexion 50 Degrees                     [facility] Adult PT Treatment/Exercise - 08/26/15 0001    Exercises   Exercises Elbow   Elbow Exercises   Elbow Flexion Strengthening;Right;20 reps;5 reps  2#    Forearm Supination AROM  on pillow using hammer for longer lever arm x 30   Forearm Pronation AROM  on pillow using hammer for longer lever arm x 30   Wrist Flexion Strengthening;Right;20 reps;Seated;10 reps  2# wt arm resting on pillow   Wrist Extension Strengthening;20 reps;10 reps;Seated  arm resting on pillow, performed 2# radial deviation 3x10   Other elbow exercises UE RANGER standing flexion, circles each way x 20   Manual Therapy   Manual Therapy Joint mobilization;Soft tissue mobilization;Myofascial release;Passive ROM   Joint Mobilization carpals/metacarpals   Soft tissue mobilization R biceps and wrist extensors   Myofascial Release along R forearm   Passive ROM R wrist and elbow                     PT Long Term Goals - 08/26/15 1012    PT LONG TERM GOAL #1   Title Ind with HEP.   Time 6   Period Weeks   Status On-going    PT LONG TERM GOAL #2   Title Full right forearm supination.   Time 6   Period Weeks   Status On-going   PT LONG TERM GOAL #3   Title Full right elbow extension.   Period Weeks   Status Achieved   PT LONG TERM GOAL #4   Title Perform ADL's with pain not > 2-3/10.   Time 6   Period Weeks   Status On-going   PT LONG TERM GOAL #5   Title Reduce right hand edema by 2 cms.   Period Weeks   Status Achieved               Plan - 08/26/15 0915    Clinical Impression Statement Pt did great today with exs and Acts for RT elbow. He has improved ROM for elbow, forearm and wrist. He was able to meet elbow ROM goal, but others are on-going   Pt will benefit from skilled therapeutic intervention in order to improve on the following deficits Pain;Decreased activity tolerance;Increased edema;Decreased range of motion   Rehab Potential Excellent   PT Frequency 2x / week   PT Duration 8 weeks   PT Treatment/Interventions ADLs/Self Care Home Management;Cryotherapy;Electrical Stimulation;Moist Heat;Ultrasound;Therapeutic activities;Therapeutic exercise;Manual techniques;Passive range of motion   PT Next Visit Plan continue ROM, strengthening per protocol.   12 weeks on 09-02-15   PT Home Exercise Plan isometrics shoulder IR/ER/ABD/ext   Consulted and Agree with Plan of Care Patient        Problem List Patient Active Problem List   Diagnosis Date Noted  . Traumatic rupture of right distal biceps tendon   . PONV (postoperative nausea and vomiting)   . Adenomatous colon polyp 03/17/2014  . Esophageal dysphagia 05/06/2011  . GERD (gastroesophageal reflux disease) 05/06/2011    RAMSEUR,CHRIS, PTA 08/26/2015, 10:18 AM  [facility] [place], [place], [address] Phone: [phone]   Fax:  [phone]  Name: Ryan Jennings MRN: [id number] Date of Birth: [birth year]

## 2015-08-28 ENCOUNTER — Encounter: Payer: Self-pay | Admitting: Physical Therapy

## 2015-08-28 ENCOUNTER — Ambulatory Visit: Payer: Commercial Managed Care - HMO | Admitting: Physical Therapy

## 2015-08-28 DIAGNOSIS — M25621 Stiffness of right elbow, not elsewhere classified: Secondary | ICD-10-CM | POA: Diagnosis not present

## 2015-08-28 DIAGNOSIS — R5381 Other malaise: Secondary | ICD-10-CM

## 2015-08-28 DIAGNOSIS — M25522 Pain in left elbow: Secondary | ICD-10-CM

## 2015-08-28 DIAGNOSIS — M25631 Stiffness of right wrist, not elsewhere classified: Secondary | ICD-10-CM

## 2015-08-28 NOTE — Therapy (Signed)
[facility] [place], [place], [address] Phone: [phone]   Fax:  [phone]  Physical Therapy Treatment  Patient Details  Name: TIMMY DWINELL MRN: [id number] Date of Birth: September 29, 1960 Referring Provider: Elsie Saas MD.  Encounter Date: 08/28/2015      PT End of Session - 08/28/15 0905    Visit Number 11   Number of Visits 16   Date for PT Re-Evaluation 09/16/15   PT Start Time 0904   PT Stop Time 0948   PT Time Calculation (min) 44 min   Activity Tolerance Patient tolerated treatment well   Behavior During Therapy [facility] for tasks assessed/performed      Past Medical History  Diagnosis Date  . GERD (gastroesophageal reflux disease)   . Nephrolithiasis   . Esophageal ring     2010, 2012  . Adenomatous colon polyp 2010  . Hyperlipidemia   . PONV (postoperative nausea and vomiting)   . Traumatic rupture of right distal biceps tendon   . PONV (postoperative nausea and vomiting)     Past Surgical History  Procedure Laterality Date  . Appendectomy  8/11  . Rotator cuff repair      right  . Hernia repair      x 3  . Biceps tendon repair    . Esophagogastroduodenoscopy  04/2009    distal erosive reflux esophagitis, schatzki ring s/p 10F, hh, couple of antral erosions  . Colonoscopy  04/2009    sigmoid adenomatous polyp, next colonoscopy 04/2014  . Esophagogastroduodenoscopy  05/2011    Dr. [name]: erosive reflux esophagitis, superimposed on Schatzki's ring and early stricture s/p 57 F, small hiatal hernia  . Colonoscopy N/A 03/24/2014    Procedure: COLONOSCOPY;  Surgeon: [name], MD;  [facility];  Service: Endoscopy;  Laterality: N/A;  1:45-moved to [place] notified pt  . Distal biceps tendon repair Right 06/03/2015    Procedure: RIGHT DISTAL BICEPS TENDON REPAIR;  Surgeon: Elsie Saas, MD;  Location: [place];  Service: Orthopedics;  Laterality: Right;    There  were no vitals filed for this visit.  Visit Diagnosis:  Decreased range of motion of elbow, right  Debility  Wrist stiffness, right  Left elbow pain      Subjective Assessment - 08/28/15 0906    Subjective Reports R elbow is just sore and worse at end of day. Reports still numb along lateral R forearm to second metatarsal head.   Patient Stated Goals regain use of right elbow.   Currently in Pain? Yes   Pain Score 2    Pain Location Elbow   Pain Orientation Right   Pain Descriptors / Indicators Sore   Pain Type Surgical pain   Pain Onset More than a month ago            [facility] PT Assessment - 08/28/15 0001    Assessment   Medical Diagnosis Right distal biceps tendon repair.   Onset Date/Surgical Date 06/03/15   Next MD Visit 09/10/2015                     [facility] Adult PT Treatment/Exercise - 08/28/15 0001    Elbow Exercises   Elbow Flexion Strengthening;Right;Other reps (comment);Seated  x30 reps   Bar Weights/Barbell (Elbow Flexion) 2 lbs   Forearm Supination Strengthening;Right;Other reps (comment);Seated;Bar weights/barbell  x30 reps; 2#   Forearm Pronation Strengthening;Right;Other reps (comment);Seated;Bar weights/barbell  x30 reps; 2#   Wrist Flexion  Strengthening;Right;Seated;Bar weights/barbell;20 reps   Bar Weights/Barbell (Wrist Flexion) 2 lbs   Wrist Extension Strengthening;Right;20 reps;Seated;Bar weights/barbell   Bar Weights/Barbell (Wrist Extension) 2 lbs   Other elbow exercises RUE ranger flexion/circles x30 reps   Other elbow exercises R red web grip x3 min   Manual Therapy   Manual Therapy Joint mobilization;Soft tissue mobilization;Passive ROM   Joint Mobilization G II joint mobilizations to wrist and carpometacarpal joints to increase ROM    Soft tissue mobilization STW to R wrist extensors, flexors, and incison mobilizations and lateral forearm to prevent adhesions and to provide sensory input in region of patient's numbness    Passive ROM Passive stretch to R wrist extensors and R wrist flexors to improve ROM                     PT Long Term Goals - 08/28/15 0948    PT LONG TERM GOAL #1   Title Ind with HEP.   Time 6   Period Weeks   Status Achieved   PT LONG TERM GOAL #2   Title Full right forearm supination.   Time 6   Period Weeks   Status On-going   PT LONG TERM GOAL #3   Title Full right elbow extension.   Period Weeks   Status Achieved   PT LONG TERM GOAL #4   Title Perform ADL's with pain not > 2-3/10.   Time 6   Period Weeks   Status Achieved   PT LONG TERM GOAL #5   Title Reduce right hand edema by 2 cms.   Period Weeks   Status Achieved               Plan - 08/28/15 1003    Clinical Impression Statement Patient continues to do very well with PT treatment well with no complaints of increased pain. Completed all wrist/elbow strengthening with 2# hand weight today with no reports of pain. R wrist extension noted as limited during passive stretching. Had no complaints during grip exercise with red web or with UE ranger exercise. Demonstrates good scar mobility. Has achieved HEP and ADLs goal today in clinic leaving LT goal of full R wrist supination as the remaining goal. Patient uses ball at home to strengthen grip per patient report and also notes that R hand sometimes feels tight.   Pt will benefit from skilled therapeutic intervention in order to improve on the following deficits Pain;Decreased activity tolerance;Increased edema;Decreased range of motion   Rehab Potential Excellent   PT Frequency 2x / week   PT Duration 8 weeks   PT Treatment/Interventions ADLs/Self Care Home Management;Cryotherapy;Electrical Stimulation;Moist Heat;Ultrasound;Therapeutic activities;Therapeutic exercise;Manual techniques;Passive range of motion   PT Next Visit Plan continue ROM, strengthening per protocol.   12 weeks on 09-02-15   PT Home Exercise Plan isometrics shoulder IR/ER/ABD/ext    Consulted and Agree with Plan of Care Patient        Problem List Patient Active Problem List   Diagnosis Date Noted  . Traumatic rupture of right distal biceps tendon   . PONV (postoperative nausea and vomiting)   . Adenomatous colon polyp 03/17/2014  . Esophageal dysphagia 05/06/2011  . GERD (gastroesophageal reflux disease) 05/06/2011    Wynelle Fanny, PTA 08/28/2015, 10:14 AM  [facility] [place], [place], [address] Phone: [phone]   Fax:  [phone]  Name: RAEQWON MCCULLAR MRN: [id number] Date of Birth: Jan 04, 1961

## 2015-09-01 ENCOUNTER — Ambulatory Visit: Payer: Commercial Managed Care - HMO | Admitting: *Deleted

## 2015-09-01 DIAGNOSIS — M25621 Stiffness of right elbow, not elsewhere classified: Secondary | ICD-10-CM

## 2015-09-01 DIAGNOSIS — M25522 Pain in left elbow: Secondary | ICD-10-CM

## 2015-09-01 DIAGNOSIS — R5381 Other malaise: Secondary | ICD-10-CM

## 2015-09-01 DIAGNOSIS — M25631 Stiffness of right wrist, not elsewhere classified: Secondary | ICD-10-CM

## 2015-09-01 NOTE — Therapy (Signed)
[facility] [place], [place], [address] Phone: [phone]   Fax:  [phone]  Physical Therapy Treatment  Patient Details  Name: Ryan Jennings MRN: [id number] Date of Birth: 1961/03/18 Referring Provider: Elsie Saas MD.  Encounter Date: 09/01/2015      PT End of Session - 09/01/15 1110    Visit Number 12   Number of Visits 16   Date for PT Re-Evaluation 09/16/15   PT Start Time 1030   PT Stop Time 1117   PT Time Calculation (min) 47 min      Past Medical History  Diagnosis Date  . GERD (gastroesophageal reflux disease)   . Nephrolithiasis   . Esophageal ring     2010, 2012  . Adenomatous colon polyp 2010  . Hyperlipidemia   . PONV (postoperative nausea and vomiting)   . Traumatic rupture of right distal biceps tendon   . PONV (postoperative nausea and vomiting)     Past Surgical History  Procedure Laterality Date  . Appendectomy  8/11  . Rotator cuff repair      right  . Hernia repair      x 3  . Biceps tendon repair    . Esophagogastroduodenoscopy  04/2009    distal erosive reflux esophagitis, schatzki ring s/p 63F, hh, couple of antral erosions  . Colonoscopy  04/2009    sigmoid adenomatous polyp, next colonoscopy 04/2014  . Esophagogastroduodenoscopy  05/2011    Dr. [name]: erosive reflux esophagitis, superimposed on Schatzki's ring and early stricture s/p 36 F, small hiatal hernia  . Colonoscopy N/A 03/24/2014    Procedure: COLONOSCOPY;  Surgeon: [name], MD;  [facility];  Service: Endoscopy;  Laterality: N/A;  1:45-moved to [place] notified pt  . Distal biceps tendon repair Right 06/03/2015    Procedure: RIGHT DISTAL BICEPS TENDON REPAIR;  Surgeon: Elsie Saas, MD;  Location: [place];  Service: Orthopedics;  Laterality: Right;    There were no vitals filed for this visit.  Visit Diagnosis:  Decreased range of motion of elbow,  right  Debility  Wrist stiffness, right  Left elbow pain      Subjective Assessment - 09/01/15 1041    Subjective Reports R elbow is just sore and worse at end of day. Reports still numb along lateral R forearm radial aspect   Patient Stated Goals regain use of right elbow.   Currently in Pain? Yes   Pain Score 2    Pain Location Elbow   Pain Orientation Right   Pain Descriptors / Indicators Sore   Pain Type Surgical pain   Pain Onset More than a month ago   Pain Frequency Constant   Pain Relieving Factors PT RX                         [facility] Adult PT Treatment/Exercise - 09/01/15 0001    Exercises   Exercises Elbow;Shoulder   Elbow Exercises   Elbow Flexion Strengthening;Right;Other reps (comment);Seated   Bar Weights/Barbell (Elbow Flexion) 2 lbs   Forearm Supination Strengthening;Right;Other reps (comment);Seated;Bar weights/barbell  x30 reps; 2#   Forearm Pronation Strengthening;Right;Other reps (comment);Seated;Bar weights/barbell  x30 reps; 2#   Wrist Flexion Strengthening;Right;Seated;Bar weights/barbell;20 reps   Bar Weights/Barbell (Wrist Flexion) 2 lbs   Wrist Extension Strengthening;Right;20 reps;Seated;Bar weights/barbell   Bar Weights/Barbell (Wrist Extension) 2 lbs   Other elbow exercises RUE ranger flexion/circles x30 reps   Other elbow exercises  R red web grip x3 min   Shoulder Exercises: Standing   Other Standing Exercises 2# ball diagonal reaches 3x10 each way   Shoulder Exercises: ROM/Strengthening   UBE (Upper Arm Bike) 120 RPMs x 6 mins forward only   Manual Therapy   Manual Therapy Joint mobilization;Soft tissue mobilization;Passive ROM   Soft tissue mobilization STW to R wrist extensors, flexors, and incison mobilizations and lateral forearm to prevent adhesions and to provide sensory input in region of patient's numbness   Passive ROM Passive stretch to R wrist extensors and R wrist flexors to improve ROM                      PT Long Term Goals - 08/28/15 0948    PT LONG TERM GOAL #1   Title Ind with HEP.   Time 6   Period Weeks   Status Achieved   PT LONG TERM GOAL #2   Title Full right forearm supination.   Time 6   Period Weeks   Status On-going   PT LONG TERM GOAL #3   Title Full right elbow extension.   Period Weeks   Status Achieved   PT LONG TERM GOAL #4   Title Perform ADL's with pain not > 2-3/10.   Time 6   Period Weeks   Status Achieved   PT LONG TERM GOAL #5   Title Reduce right hand edema by 2 cms.   Period Weeks   Status Achieved               Plan - 09/01/15 1112    Clinical Impression Statement Pt did well with PT Rx today and was able to perform new exs for RT UE. He is almost 3 months post-op and is doing well. His ROM and functional use continues to improve.   Pt will benefit from skilled therapeutic intervention in order to improve on the following deficits Pain;Decreased activity tolerance;Increased edema;Decreased range of motion   Rehab Potential Excellent   PT Frequency 2x / week   PT Duration 8 weeks   PT Treatment/Interventions ADLs/Self Care Home Management;Cryotherapy;Electrical Stimulation;Moist Heat;Ultrasound;Therapeutic activities;Therapeutic exercise;Manual techniques;Passive range of motion   PT Next Visit Plan continue ROM, strengthening per protocol.   12 weeks on 09-02-15   PT Home Exercise Plan isometrics shoulder IR/ER/ABD/ext   Consulted and Agree with Plan of Care Patient        Problem List Patient Active Problem List   Diagnosis Date Noted  . Traumatic rupture of right distal biceps tendon   . PONV (postoperative nausea and vomiting)   . Adenomatous colon polyp 03/17/2014  . Esophageal dysphagia 05/06/2011  . GERD (gastroesophageal reflux disease) 05/06/2011    Mariama Saintvil,CHRIS, PTA 09/01/2015, 1:14 PM  [facility] [place], [place],  [address] Phone: [phone]   Fax:  [phone]  Name: Ryan Jennings MRN: [id number] Date of Birth: 08-29-1961

## 2015-09-08 ENCOUNTER — Ambulatory Visit: Payer: Commercial Managed Care - HMO | Attending: Orthopedic Surgery | Admitting: *Deleted

## 2015-09-08 DIAGNOSIS — M25522 Pain in left elbow: Secondary | ICD-10-CM | POA: Diagnosis present

## 2015-09-08 DIAGNOSIS — M25631 Stiffness of right wrist, not elsewhere classified: Secondary | ICD-10-CM | POA: Insufficient documentation

## 2015-09-08 DIAGNOSIS — R5381 Other malaise: Secondary | ICD-10-CM | POA: Diagnosis present

## 2015-09-08 DIAGNOSIS — M25621 Stiffness of right elbow, not elsewhere classified: Secondary | ICD-10-CM | POA: Diagnosis not present

## 2015-09-08 NOTE — Therapy (Signed)
[facility] [place], [place], [address] Phone: [phone]   Fax:  [phone]  Physical Therapy Treatment  Patient Details  Name: Ryan Jennings MRN: [id number] Date of Birth: Dec 17, 1960 Referring Provider: Elsie Saas MD.  Encounter Date: 09/08/2015      PT End of Session - 09/08/15 0823    Visit Number 13   Number of Visits 16   Date for PT Re-Evaluation 09/16/15   PT Start Time 0815   PT Stop Time 0906   PT Time Calculation (min) 51 min      Past Medical History  Diagnosis Date  . GERD (gastroesophageal reflux disease)   . Nephrolithiasis   . Esophageal ring     2010, 2012  . Adenomatous colon polyp 2010  . Hyperlipidemia   . PONV (postoperative nausea and vomiting)   . Traumatic rupture of right distal biceps tendon   . PONV (postoperative nausea and vomiting)     Past Surgical History  Procedure Laterality Date  . Appendectomy  8/11  . Rotator cuff repair      right  . Hernia repair      x 3  . Biceps tendon repair    . Esophagogastroduodenoscopy  04/2009    distal erosive reflux esophagitis, schatzki ring s/p 89F, hh, couple of antral erosions  . Colonoscopy  04/2009    sigmoid adenomatous polyp, next colonoscopy 04/2014  . Esophagogastroduodenoscopy  05/2011    Dr. [name]: erosive reflux esophagitis, superimposed on Schatzki's ring and early stricture s/p 72 F, small hiatal hernia  . Colonoscopy N/A 03/24/2014    Procedure: COLONOSCOPY;  Surgeon: [name], MD;  [facility];  Service: Endoscopy;  Laterality: N/A;  1:45-moved to [place] notified pt  . Distal biceps tendon repair Right 06/03/2015    Procedure: RIGHT DISTAL BICEPS TENDON REPAIR;  Surgeon: Elsie Saas, MD;  Location: [place];  Service: Orthopedics;  Laterality: Right;    There were no vitals filed for this visit.  Visit Diagnosis:  Decreased range of motion of elbow, right  Debility  Wrist  stiffness, right  Left elbow pain      Subjective Assessment - 09/08/15 0820    Subjective Reports R elbow is just sore and worse at end of day. Reports still numb along lateral R forearm radial aspect. To MD Thursday   Patient Stated Goals regain use of right elbow.   Currently in Pain? Yes   Pain Score 1    Pain Location Elbow   Pain Orientation Right   Pain Descriptors / Indicators Sore   Pain Type Surgical pain   Pain Onset More than a month ago   Pain Frequency Intermittent   Aggravating Factors  movement   Pain Relieving Factors PT Rx            [facility] PT Assessment - 09/08/15 0001    AROM   AROM Assessment Site Elbow;Forearm;Wrist   Right/Left Elbow Right   Right Elbow Flexion 140   Right Elbow Extension 0   Right Forearm Pronation 80 Degrees   Right Forearm Supination 75 Degrees   Right Wrist Extension 56 Degrees   Right Wrist Flexion 50 Degrees                     [facility] Adult PT Treatment/Exercise - 09/08/15 0001    Exercises   Exercises Elbow;Shoulder   Elbow Exercises   Elbow Flexion Strengthening;Right;Other reps (comment);Seated  Bar Weights/Barbell (Elbow Flexion) 3 lbs   Forearm Supination Strengthening;Right;Other reps (comment);Seated;Bar weights/barbell  x30 reps; 3#   Forearm Pronation Strengthening;Right;Other reps (comment);Seated;Bar weights/barbell  x30 reps; 3#   Wrist Flexion Strengthening;Right;Seated;Bar weights/barbell;20 reps   Bar Weights/Barbell (Wrist Flexion) 3 lbs   Wrist Extension Strengthening;Right;20 reps;Seated;Bar weights/barbell   Bar Weights/Barbell (Wrist Extension) 3 lbs   Other elbow exercises RUE ranger flexion/circles x30 reps   Other elbow exercises R red web grip x3 min   Shoulder Exercises: ROM/Strengthening   UBE (Upper Arm Bike) 120 RPMs x 6 mins forward only   Manual Therapy   Manual Therapy Joint mobilization;Soft tissue mobilization;Passive ROM   Soft tissue mobilization STW to R wrist  extensors, flexors, and incison mobilizations and lateral forearm to prevent adhesions and to provide sensory input in region of patient's numbness   Passive ROM Passive stretch to R wrist extensors and R wrist flexors to improve ROM                     PT Long Term Goals - 08/28/15 0948    PT LONG TERM GOAL #1   Title Ind with HEP.   Time 6   Period Weeks   Status Achieved   PT LONG TERM GOAL #2   Title Full right forearm supination.   Time 6   Period Weeks   Status On-going   PT LONG TERM GOAL #3   Title Full right elbow extension.   Period Weeks   Status Achieved   PT LONG TERM GOAL #4   Title Perform ADL's with pain not > 2-3/10.   Time 6   Period Weeks   Status Achieved   PT LONG TERM GOAL #5   Title Reduce right hand edema by 2 cms.   Period Weeks   Status Achieved               Plan - 09/08/15 0906    Clinical Impression Statement Pt did great today and continues to progress with PREs and functional  ACT's with RT UE. He has met ROM goals except for supination of RT forearm. He continues to have numbness along radius and into thumb and 1st digit.   Pt will benefit from skilled therapeutic intervention in order to improve on the following deficits Pain;Decreased activity tolerance;Increased edema;Decreased range of motion   Rehab Potential Excellent   PT Frequency 2x / week   PT Duration 8 weeks   PT Treatment/Interventions ADLs/Self Care Home Management;Cryotherapy;Electrical Stimulation;Moist Heat;Ultrasound;Therapeutic activities;Therapeutic exercise;Manual techniques;Passive range of motion   PT Next Visit Plan continue ROM, strengthening per protocol.   12 weeks on 09-02-15     Continue as per MDs NEW Script or FAX   Consulted and Agree with Plan of Care Patient        Problem List Patient Active Problem List   Diagnosis Date Noted  . Traumatic rupture of right distal biceps tendon   . PONV (postoperative nausea and vomiting)   .  Adenomatous colon polyp 03/17/2014  . Esophageal dysphagia 05/06/2011  . GERD (gastroesophageal reflux disease) 05/06/2011    APPLEGATE, [place] , PTA  09/08/2015, 9:44 AM [place] Applegate MPT [facility] [place], [place], [address] Phone: [phone]   Fax:  [phone]  Name: WILHELM GANAWAY MRN: [id number] Date of Birth: 11/11/1960

## 2015-09-11 ENCOUNTER — Encounter: Payer: Self-pay | Admitting: *Deleted

## 2015-09-11 ENCOUNTER — Ambulatory Visit: Payer: Commercial Managed Care - HMO | Admitting: *Deleted

## 2015-09-11 DIAGNOSIS — M25522 Pain in left elbow: Secondary | ICD-10-CM

## 2015-09-11 DIAGNOSIS — M25621 Stiffness of right elbow, not elsewhere classified: Secondary | ICD-10-CM

## 2015-09-11 DIAGNOSIS — R5381 Other malaise: Secondary | ICD-10-CM

## 2015-09-11 DIAGNOSIS — M25631 Stiffness of right wrist, not elsewhere classified: Secondary | ICD-10-CM

## 2015-09-11 NOTE — Therapy (Signed)
[facility] [place], [place], [address] Phone: [phone]   Fax:  [phone]  Physical Therapy Treatment  Patient Details  Name: Ryan Jennings MRN: [id number] Date of Birth: 04-04-1961 Referring Provider: Elsie Saas MD.  Encounter Date: 09/11/2015      PT End of Session - 09/11/15 0821    Visit Number 14   Number of Visits 24   Date for PT Re-Evaluation 10/17/15  New Order to cont. PT 09-11-15   PT Start Time 0815   PT Stop Time 0903   PT Time Calculation (min) 48 min      Past Medical History  Diagnosis Date  . GERD (gastroesophageal reflux disease)   . Nephrolithiasis   . Esophageal ring     2010, 2012  . Adenomatous colon polyp 2010  . Hyperlipidemia   . PONV (postoperative nausea and vomiting)   . Traumatic rupture of right distal biceps tendon   . PONV (postoperative nausea and vomiting)     Past Surgical History  Procedure Laterality Date  . Appendectomy  8/11  . Rotator cuff repair      right  . Hernia repair      x 3  . Biceps tendon repair    . Esophagogastroduodenoscopy  04/2009    distal erosive reflux esophagitis, schatzki ring s/p 14F, hh, couple of antral erosions  . Colonoscopy  04/2009    sigmoid adenomatous polyp, next colonoscopy 04/2014  . Esophagogastroduodenoscopy  05/2011    Dr. [name]: erosive reflux esophagitis, superimposed on Schatzki's ring and early stricture s/p 56 F, small hiatal hernia  . Colonoscopy N/A 03/24/2014    Procedure: COLONOSCOPY;  Surgeon: [name], MD;  [facility];  Service: Endoscopy;  Laterality: N/A;  1:45-moved to [place] notified pt  . Distal biceps tendon repair Right 06/03/2015    Procedure: RIGHT DISTAL BICEPS TENDON REPAIR;  Surgeon: Elsie Saas, MD;  Location: [place];  Service: Orthopedics;  Laterality: Right;    There were no vitals filed for this visit.  Visit Diagnosis:  Decreased range of motion of  elbow, right  Debility  Wrist stiffness, right  Left elbow pain      Subjective Assessment - 09/11/15 0817    Subjective New MD order to cont. PT. OOW until February. He was pleased with progress. RT elbow is sore today   Patient Stated Goals regain use of right elbow.   Currently in Pain? Yes   Pain Score 2    Pain Location Elbow   Pain Orientation Right   Pain Descriptors / Indicators Sore   Pain Type Surgical pain   Pain Onset More than a month ago   Aggravating Factors  movement   Pain Relieving Factors PT Rxs                         [facility] Adult PT Treatment/Exercise - 09/11/15 0001    Exercises   Exercises Elbow;Shoulder   Elbow Exercises   Elbow Flexion Strengthening;Right;Other reps (comment);Seated   Bar Weights/Barbell (Elbow Flexion) 3 lbs   Forearm Supination Strengthening;Right;Other reps (comment);Seated;Bar weights/barbell  x30 reps; 3#   Forearm Pronation Strengthening;Right;Other reps (comment);Seated;Bar weights/barbell  x30 reps; 3#   Wrist Flexion Strengthening;Right;Seated;Bar weights/barbell;20 reps   Bar Weights/Barbell (Wrist Flexion) 3 lbs   Wrist Extension Strengthening;Right;20 reps;Seated;Bar weights/barbell   Bar Weights/Barbell (Wrist Extension) 3 lbs   Other elbow exercises RUE ranger flexion/circles x30  reps   Other elbow exercises R red web grip x3 min   Shoulder Exercises: Standing   Other Standing Exercises 2# ball diagonal reaches 3x10 each way   Other Standing Exercises Standing wall pushups 3 x10   Shoulder Exercises: ROM/Strengthening   UBE (Upper Arm Bike) 120 RPMs x 8 mins forward only   Manual Therapy   Manual Therapy Joint mobilization;Soft tissue mobilization;Passive ROM   Soft tissue mobilization STW to R wrist extensors, flexors, and incison mobilizations and lateral forearm to prevent adhesions and to provide sensory input in region of patient's numbness   Passive ROM Passive stretch to R wrist extensors  and R wrist flexors to improve ROM                     PT Long Term Goals - 08/28/15 0948    PT LONG TERM GOAL #1   Title Ind with HEP.   Time 6   Period Weeks   Status Achieved   PT LONG TERM GOAL #2   Title Full right forearm supination.   Time 6   Period Weeks   Status On-going   PT LONG TERM GOAL #3   Title Full right elbow extension.   Period Weeks   Status Achieved   PT LONG TERM GOAL #4   Title Perform ADL's with pain not > 2-3/10.   Time 6   Period Weeks   Status Achieved   PT LONG TERM GOAL #5   Title Reduce right hand edema by 2 cms.   Period Weeks   Status Achieved               Plan - 09/11/15 0837    Clinical Impression Statement Pt continues to progress with exs as per protocol. He was able to progress to 3#s today with exs and started wall push-ups. MD said he was doing well and gave a N.O. to cont with PT   Pt will benefit from skilled therapeutic intervention in order to improve on the following deficits Pain;Decreased activity tolerance;Increased edema;Decreased range of motion   PT Frequency 2x / week   PT Duration 8 weeks   PT Treatment/Interventions ADLs/Self Care Home Management;Cryotherapy;Electrical Stimulation;Moist Heat;Ultrasound;Therapeutic activities;Therapeutic exercise;Manual techniques;Passive range of motion   PT Next Visit Plan continue ROM, strengthening per protocol.   12 weeks on 09-02-15     Continue as per MD's NEW ORDER  09-11-15  as per protocol   PT Home Exercise Plan isometrics shoulder IR/ER/ABD/ext   Consulted and Agree with Plan of Care Patient        Problem List Patient Active Problem List   Diagnosis Date Noted  . Traumatic rupture of right distal biceps tendon   . PONV (postoperative nausea and vomiting)   . Adenomatous colon polyp 03/17/2014  . Esophageal dysphagia 05/06/2011  . GERD (gastroesophageal reflux disease) 05/06/2011    Jessia Kief,CHRIS, PTA 09/11/2015, 9:18 AM  [facility] [address] [place], [place], [address] Phone: [phone]   Fax:  [phone]  Name: FREDRIC PRIMAS MRN: [id number] Date of Birth: [birth year]

## 2015-09-16 ENCOUNTER — Ambulatory Visit: Payer: Commercial Managed Care - HMO | Admitting: *Deleted

## 2015-09-16 ENCOUNTER — Encounter: Payer: Self-pay | Admitting: *Deleted

## 2015-09-16 DIAGNOSIS — M25621 Stiffness of right elbow, not elsewhere classified: Secondary | ICD-10-CM | POA: Diagnosis not present

## 2015-09-16 DIAGNOSIS — R5381 Other malaise: Secondary | ICD-10-CM

## 2015-09-16 DIAGNOSIS — M25631 Stiffness of right wrist, not elsewhere classified: Secondary | ICD-10-CM

## 2015-09-16 DIAGNOSIS — M25522 Pain in left elbow: Secondary | ICD-10-CM

## 2015-09-16 NOTE — Therapy (Signed)
[facility] [place], [place], [address] Phone: [phone]   Fax:  [phone]  Physical Therapy Treatment  Patient Details  Name: Ryan Jennings MRN: [id number] Date of Birth: 10-22-1960 Referring Provider: Elsie Saas MD.  Encounter Date: 09/16/2015      PT End of Session - 09/16/15 0811    Visit Number 15   Number of Visits 24   Date for PT Re-Evaluation 10/17/15   PT Start Time 0815   PT Stop Time 0905   PT Time Calculation (min) 50 min      Past Medical History  Diagnosis Date  . GERD (gastroesophageal reflux disease)   . Nephrolithiasis   . Esophageal ring     2010, 2012  . Adenomatous colon polyp 2010  . Hyperlipidemia   . PONV (postoperative nausea and vomiting)   . Traumatic rupture of right distal biceps tendon   . PONV (postoperative nausea and vomiting)     Past Surgical History  Procedure Laterality Date  . Appendectomy  8/11  . Rotator cuff repair      right  . Hernia repair      x 3  . Biceps tendon repair    . Esophagogastroduodenoscopy  04/2009    distal erosive reflux esophagitis, schatzki ring s/p 30F, hh, couple of antral erosions  . Colonoscopy  04/2009    sigmoid adenomatous polyp, next colonoscopy 04/2014  . Esophagogastroduodenoscopy  05/2011    Dr. [name]: erosive reflux esophagitis, superimposed on Schatzki's ring and early stricture s/p 55 F, small hiatal hernia  . Colonoscopy N/A 03/24/2014    Procedure: COLONOSCOPY;  Surgeon: [name], MD;  [facility];  Service: Endoscopy;  Laterality: N/A;  1:45-moved to [place] notified pt  . Distal biceps tendon repair Right 06/03/2015    Procedure: RIGHT DISTAL BICEPS TENDON REPAIR;  Surgeon: Elsie Saas, MD;  Location: [place];  Service: Orthopedics;  Laterality: Right;    There were no vitals filed for this visit.  Visit Diagnosis:  Decreased range of motion of elbow,  right  Debility  Wrist stiffness, right  Left elbow pain      Subjective Assessment - 09/16/15 0812    Subjective New MD order to cont. PT. OOW until February. He was pleased with progress. RT elbow is sore today. 2-3/10   Currently in Pain? Yes   Pain Score 2    Pain Location Elbow   Pain Orientation Right   Pain Descriptors / Indicators Sore   Pain Type Surgical pain   Pain Onset More than a month ago   Pain Frequency Intermittent   Aggravating Factors  cold weather   Pain Relieving Factors PT Rxs                         [facility] Adult PT Treatment/Exercise - 09/16/15 0001    Exercises   Exercises Shoulder;Elbow;Wrist   Elbow Exercises   Elbow Flexion Strengthening;Right;Other reps (comment);Seated   Bar Weights/Barbell (Elbow Flexion) 4 lbs   Elbow Extension Strengthening;Both  XTS pink 3x10   Forearm Supination Strengthening;Right;Other reps (comment);Seated;Bar weights/barbell  x30 reps; 4#   Forearm Pronation Strengthening;Right;Other reps (comment);Seated;Bar weights/barbell  x30 reps; 4#   Wrist Flexion Strengthening;Right;Seated;Bar weights/barbell;20 reps;10 reps   Bar Weights/Barbell (Wrist Flexion) 4 lbs   Wrist Extension Strengthening;Right;10 reps;20 reps;Seated   Bar Weights/Barbell (Wrist Extension) 4 lbs   Other elbow exercises RUE ranger flexion/circles x30  reps   Other elbow exercises R red web grip x3 min   Shoulder Exercises: Standing   Other Standing Exercises 4# ball diagonal reaches 3x10 each way   Other Standing Exercises Standing wall pushups 3 x10   Shoulder Exercises: ROM/Strengthening   UBE (Upper Arm Bike) 120 RPMs x 8 mins forward only   Manual Therapy   Manual Therapy Joint mobilization;Soft tissue mobilization;Passive ROM   Soft tissue mobilization STW to R wrist extensors, flexors, and incison mobilizations and lateral forearm to prevent adhesions and to provide sensory input in region of patient's numbness   Passive  ROM Passive stretch to R wrist extensors and R wrist flexors to improve ROM                     PT Long Term Goals - 08/28/15 0948    PT LONG TERM GOAL #1   Title Ind with HEP.   Time 6   Period Weeks   Status Achieved   PT LONG TERM GOAL #2   Title Full right forearm supination.   Time 6   Period Weeks   Status On-going   PT LONG TERM GOAL #3   Title Full right elbow extension.   Period Weeks   Status Achieved   PT LONG TERM GOAL #4   Title Perform ADL's with pain not > 2-3/10.   Time 6   Period Weeks   Status Achieved   PT LONG TERM GOAL #5   Title Reduce right hand edema by 2 cms.   Period Weeks   Status Achieved               Plan - 09/16/15 0905    Clinical Impression Statement Pt. did great today with only mild soreness in RT elbow. He did well with tricep pushdowns and added resistance to PREs. LTGs are on-going   Pt will benefit from skilled therapeutic intervention in order to improve on the following deficits Pain;Decreased activity tolerance;Increased edema;Decreased range of motion   Rehab Potential Excellent   PT Frequency 2x / week   PT Duration 8 weeks   PT Treatment/Interventions ADLs/Self Care Home Management;Cryotherapy;Electrical Stimulation;Moist Heat;Ultrasound;Therapeutic activities;Therapeutic exercise;Manual techniques;Passive range of motion   PT Next Visit Plan continue ROM, strengthening per protocol.   12 weeks on 09-02-15     Continue as per MD's NEW ORDER  09-11-15  as per protocol   PT Home Exercise Plan isometrics shoulder IR/ER/ABD/ext   Consulted and Agree with Plan of Care Patient        Problem List Patient Active Problem List   Diagnosis Date Noted  . Traumatic rupture of right distal biceps tendon   . PONV (postoperative nausea and vomiting)   . Adenomatous colon polyp 03/17/2014  . Esophageal dysphagia 05/06/2011  . GERD (gastroesophageal reflux disease) 05/06/2011    RAMSEUR,CHRIS, PTA 09/16/2015, 9:11  AM  [facility] [place], [place], [address] Phone: [phone]   Fax:  [phone]  Name: Ryan Jennings MRN: [id number] Date of Birth: [birth year]

## 2015-09-18 ENCOUNTER — Ambulatory Visit: Payer: Commercial Managed Care - HMO | Admitting: Physical Therapy

## 2015-09-18 ENCOUNTER — Encounter: Payer: Self-pay | Admitting: Physical Therapy

## 2015-09-18 DIAGNOSIS — M25621 Stiffness of right elbow, not elsewhere classified: Secondary | ICD-10-CM | POA: Diagnosis not present

## 2015-09-18 DIAGNOSIS — M25631 Stiffness of right wrist, not elsewhere classified: Secondary | ICD-10-CM

## 2015-09-18 DIAGNOSIS — M25522 Pain in left elbow: Secondary | ICD-10-CM

## 2015-09-18 DIAGNOSIS — R5381 Other malaise: Secondary | ICD-10-CM

## 2015-09-18 NOTE — Therapy (Signed)
[facility] [place], [place], [address] Phone: [phone]   Fax:  [phone]  Physical Therapy Treatment  Patient Details  Name: Ryan Jennings MRN: [id number] Date of Birth: 11-Jun-1961 Referring Provider: Elsie Saas MD.  Encounter Date: 09/18/2015      PT End of Session - 09/18/15 0821    Visit Number 16   Number of Visits 24   Date for PT Re-Evaluation 10/17/15   PT Start Time 0816   PT Stop Time 0900   PT Time Calculation (min) 44 min   Activity Tolerance Patient tolerated treatment well   Behavior During Therapy [facility] for tasks assessed/performed      Past Medical History  Diagnosis Date  . GERD (gastroesophageal reflux disease)   . Nephrolithiasis   . Esophageal ring     2010, 2012  . Adenomatous colon polyp 2010  . Hyperlipidemia   . PONV (postoperative nausea and vomiting)   . Traumatic rupture of right distal biceps tendon   . PONV (postoperative nausea and vomiting)     Past Surgical History  Procedure Laterality Date  . Appendectomy  8/11  . Rotator cuff repair      right  . Hernia repair      x 3  . Biceps tendon repair    . Esophagogastroduodenoscopy  04/2009    distal erosive reflux esophagitis, schatzki ring s/p 34F, hh, couple of antral erosions  . Colonoscopy  04/2009    sigmoid adenomatous polyp, next colonoscopy 04/2014  . Esophagogastroduodenoscopy  05/2011    Dr. [name]: erosive reflux esophagitis, superimposed on Schatzki's ring and early stricture s/p 74 F, small hiatal hernia  . Colonoscopy N/A 03/24/2014    Procedure: COLONOSCOPY;  Surgeon: [name], MD;  [facility];  Service: Endoscopy;  Laterality: N/A;  1:45-moved to [place] notified pt  . Distal biceps tendon repair Right 06/03/2015    Procedure: RIGHT DISTAL BICEPS TENDON REPAIR;  Surgeon: Elsie Saas, MD;  Location: [place];  Service: Orthopedics;  Laterality: Right;    There were  no vitals filed for this visit.  Visit Diagnosis:  Decreased range of motion of elbow, right  Debility  Wrist stiffness, right  Left elbow pain      Subjective Assessment - 09/18/15 0816    Subjective Reports that his elbow is "getting there." States that elbow is just sore and states that he feels it more following therapy and if he did anything at home.   Patient Stated Goals regain use of right elbow.   Currently in Pain? Yes   Pain Score 1    Pain Location Elbow   Pain Orientation Right   Pain Descriptors / Indicators Sore   Pain Type Surgical pain   Pain Onset More than a month ago            [facility] PT Assessment - 09/18/15 0001    Assessment   Medical Diagnosis Right distal biceps tendon repair.   Onset Date/Surgical Date 06/03/15   Next MD Visit 10/05/2015                     [facility] Adult PT Treatment/Exercise - 09/18/15 0001    Elbow Exercises   Elbow Flexion Strengthening;Right;Other reps (comment);Seated  x30 reps   Bar Weights/Barbell (Elbow Flexion) 4 lbs   Elbow Extension Strengthening;Both  x30 reps with Pink XTS   Forearm Supination Strengthening;Right;Other reps (comment);Seated;Bar weights/barbell  4# x30  reps   Forearm Pronation Strengthening;Right;Other reps (comment);Seated;Bar weights/barbell  x30 reps 4#   Wrist Flexion Strengthening;Right;Seated;Bar weights/barbell  x30 reps   Bar Weights/Barbell (Wrist Flexion) 4 lbs   Wrist Extension Strengthening;Right;Seated;Bar weights/barbell  x30 reps   Bar Weights/Barbell (Wrist Extension) 4 lbs   Other elbow exercises RUE ranger flexion/circles x30 reps   Other elbow exercises R red web grip x3 min   Shoulder Exercises: Standing   Other Standing Exercises 4# ball diagonal reaches 3x10 each way   Other Standing Exercises Standing wall pushups 3 x10   Shoulder Exercises: ROM/Strengthening   UBE (Upper Arm Bike) 120 RPMs x 8 mins forward only                     PT Long  Term Goals - 08/28/15 0948    PT LONG TERM GOAL #1   Title Ind with HEP.   Time 6   Period Weeks   Status Achieved   PT LONG TERM GOAL #2   Title Full right forearm supination.   Time 6   Period Weeks   Status On-going   PT LONG TERM GOAL #3   Title Full right elbow extension.   Period Weeks   Status Achieved   PT LONG TERM GOAL #4   Title Perform ADL's with pain not > 2-3/10.   Time 6   Period Weeks   Status Achieved   PT LONG TERM GOAL #5   Title Reduce right hand edema by 2 cms.   Period Weeks   Status Achieved               Plan - 09/18/15 0919    Clinical Impression Statement Patient continues to tolerate treatment's well with no reports of increased pain verbalized today. Fatigue noted in R elbow during tricep extensions with pink XTS when moving out of extension. Patient also reported fatigue with 4# diagonals and patient also initiated forearm rotation during the diagonals as well. Continues to have decreased sensation along R radius into 4th and 5th digits and contiues to report decreased grip especially between the 4th and 5th digits. Only remaining goal on-going secondary to deficit in R forearm    Pt will benefit from skilled therapeutic intervention in order to improve on the following deficits Pain;Decreased activity tolerance;Increased edema;Decreased range of motion   Rehab Potential Excellent   PT Frequency 2x / week   PT Duration 8 weeks   PT Treatment/Interventions ADLs/Self Care Home Management;Cryotherapy;Electrical Stimulation;Moist Heat;Ultrasound;Therapeutic activities;Therapeutic exercise;Manual techniques;Passive range of motion   PT Next Visit Plan continue ROM, strengthening per protocol.      PT Home Exercise Plan isometrics shoulder IR/ER/ABD/ext   Consulted and Agree with Plan of Care Patient        Problem List Patient Active Problem List   Diagnosis Date Noted  . Traumatic rupture of right distal biceps tendon   . PONV  (postoperative nausea and vomiting)   . Adenomatous colon polyp 03/17/2014  . Esophageal dysphagia 05/06/2011  . GERD (gastroesophageal reflux disease) 05/06/2011    Wynelle Fanny, PTA 09/18/2015, 9:25 AM  [facility] [address] [place], [place], [address] Phone: [phone]   Fax:  [phone]  Name: Ryan Jennings MRN: [id number] Date of Birth: [birth year]

## 2015-09-22 ENCOUNTER — Encounter: Payer: Self-pay | Admitting: Physical Therapy

## 2015-09-22 ENCOUNTER — Ambulatory Visit: Payer: Commercial Managed Care - HMO | Admitting: Physical Therapy

## 2015-09-22 DIAGNOSIS — M25631 Stiffness of right wrist, not elsewhere classified: Secondary | ICD-10-CM

## 2015-09-22 DIAGNOSIS — M25621 Stiffness of right elbow, not elsewhere classified: Secondary | ICD-10-CM | POA: Diagnosis not present

## 2015-09-22 DIAGNOSIS — R5381 Other malaise: Secondary | ICD-10-CM

## 2015-09-22 DIAGNOSIS — M25522 Pain in left elbow: Secondary | ICD-10-CM

## 2015-09-22 NOTE — Therapy (Signed)
[facility] [place], [place], [address] Phone: [phone]   Fax:  [phone]  Physical Therapy Treatment  Patient Details  Name: Ryan Jennings MRN: [id number] Date of Birth: February 10, 1961 Referring Provider: Elsie Saas MD.  Encounter Date: 09/22/2015      PT End of Session - 09/22/15 0824    Visit Number 17   Number of Visits 24   Date for PT Re-Evaluation 10/17/15   PT Start Time 0815   PT Stop Time 0855   PT Time Calculation (min) 40 min   Activity Tolerance Patient tolerated treatment well   Behavior During Therapy [facility] for tasks assessed/performed      Past Medical History  Diagnosis Date  . GERD (gastroesophageal reflux disease)   . Nephrolithiasis   . Esophageal ring     2010, 2012  . Adenomatous colon polyp 2010  . Hyperlipidemia   . PONV (postoperative nausea and vomiting)   . Traumatic rupture of right distal biceps tendon   . PONV (postoperative nausea and vomiting)     Past Surgical History  Procedure Laterality Date  . Appendectomy  8/11  . Rotator cuff repair      right  . Hernia repair      x 3  . Biceps tendon repair    . Esophagogastroduodenoscopy  04/2009    distal erosive reflux esophagitis, schatzki ring s/p 39F, hh, couple of antral erosions  . Colonoscopy  04/2009    sigmoid adenomatous polyp, next colonoscopy 04/2014  . Esophagogastroduodenoscopy  05/2011    Dr. [name]: erosive reflux esophagitis, superimposed on Schatzki's ring and early stricture s/p 65 F, small hiatal hernia  . Colonoscopy N/A 03/24/2014    Procedure: COLONOSCOPY;  Surgeon: [name], MD;  [facility];  Service: Endoscopy;  Laterality: N/A;  1:45-moved to [place] notified pt  . Distal biceps tendon repair Right 06/03/2015    Procedure: RIGHT DISTAL BICEPS TENDON REPAIR;  Surgeon: Elsie Saas, MD;  Location: [place];  Service: Orthopedics;  Laterality: Right;    There were  no vitals filed for this visit.  Visit Diagnosis:  Decreased range of motion of elbow, right  Debility  Wrist stiffness, right  Left elbow pain      Subjective Assessment - 09/22/15 0823    Subjective Reports that R elbow is alright with a little pain. Reports that in the late evening his elbow will ache at times. Reports that at home he has been using a hammer for stretch into supination.   Patient Stated Goals regain use of right elbow.   Currently in Pain? Yes   Pain Score 3    Pain Location Elbow   Pain Orientation Right   Pain Descriptors / Indicators Sore   Pain Type Surgical pain   Pain Onset More than a month ago            [facility] PT Assessment - 09/22/15 0001    Assessment   Medical Diagnosis Right distal biceps tendon repair.   Onset Date/Surgical Date 06/03/15   Next MD Visit 10/05/2015                     [facility] Adult PT Treatment/Exercise - 09/22/15 0001    Elbow Exercises   Elbow Flexion Strengthening;Right;Other reps (comment);Seated  x30 reps   Bar Weights/Barbell (Elbow Flexion) 4 lbs   Elbow Extension Strengthening;Both  Pink XTS; 30 reps   Forearm Supination Strengthening;Right;Other  reps (comment);Seated;Bar weights/barbell  4# x30 reps   Forearm Pronation Strengthening;Right;Other reps (comment);Seated;Bar weights/barbell  4# x30 reps   Wrist Flexion Strengthening;Right;Seated;Bar weights/barbell  x30 reps   Bar Weights/Barbell (Wrist Flexion) 4 lbs   Wrist Extension Strengthening;Right;Seated;Bar weights/barbell  x30 reps   Bar Weights/Barbell (Wrist Extension) 4 lbs   Other elbow exercises RUE ranger flexion/circles x30 reps   Other elbow exercises R red web grip x3 min   Shoulder Exercises: Standing   Other Standing Exercises Standing wall pushups 3 x10   Shoulder Exercises: ROM/Strengthening   UBE (Upper Arm Bike) 90 RPMs x8 min forward only   Wrist Exercises   Wrist Radial Deviation Strengthening;Right;20 reps;Seated;Bar  weights/barbell   Bar Weights/Barbell (Radial Deviation) 4 lbs   Manual Therapy   Manual Therapy Soft tissue mobilization;Passive ROM   Soft tissue mobilization STW to R wrist extensors, flexors, and incison mobilizations and lateral forearm to prevent adhesions and to provide sensory input in region of patient's numbness   Passive ROM Passive stretch to R wrist extensors, wrist flexors, forearm supinators to improve ROM                     PT Long Term Goals - 08/28/15 0948    PT LONG TERM GOAL #1   Title Ind with HEP.   Time 6   Period Weeks   Status Achieved   PT LONG TERM GOAL #2   Title Full right forearm supination.   Time 6   Period Weeks   Status On-going   PT LONG TERM GOAL #3   Title Full right elbow extension.   Period Weeks   Status Achieved   PT LONG TERM GOAL #4   Title Perform ADL's with pain not > 2-3/10.   Time 6   Period Weeks   Status Achieved   PT LONG TERM GOAL #5   Title Reduce right hand edema by 2 cms.   Period Weeks   Status Achieved               Plan - 09/22/15 0858    Clinical Impression Statement Patient continues to progress well with treatments and resistance of the exercises but had no reports of increased pain verbalized today. Decreased fatigue noted with B elbow extensions with pink XTS today. 4# diagonals continues to be minimally difficult and results in fatigue especially diagonal going into extension. With forearm supination exercise patient experienced pull in proximal and distal forearm. Tolerated manual therapy well today with no reports of increased pain or soreness with stretches or STW. Experienced elbow feeling about the same as it was prior to treatment.   Pt will benefit from skilled therapeutic intervention in order to improve on the following deficits Pain;Decreased activity tolerance;Increased edema;Decreased range of motion   Rehab Potential Excellent   PT Frequency 2x / week   PT Duration 8 weeks   PT  Treatment/Interventions ADLs/Self Care Home Management;Cryotherapy;Electrical Stimulation;Moist Heat;Ultrasound;Therapeutic activities;Therapeutic exercise;Manual techniques;Passive range of motion   PT Next Visit Plan continue ROM, strengthening per protocol.      PT Home Exercise Plan isometrics shoulder IR/ER/ABD/ext   Consulted and Agree with Plan of Care Patient        Problem List Patient Active Problem List   Diagnosis Date Noted  . Traumatic rupture of right distal biceps tendon   . PONV (postoperative nausea and vomiting)   . Adenomatous colon polyp 03/17/2014  . Esophageal dysphagia 05/06/2011  . GERD (gastroesophageal reflux disease) 05/06/2011  Wynelle Fanny, PTA 09/22/2015, 9:04 AM  [facility] [address] [place], [place], [address] Phone: [phone]   Fax:  [phone]  Name: LONNELL BEA MRN: [id number] Date of Birth: 1961-01-23

## 2015-09-24 ENCOUNTER — Encounter: Payer: Self-pay | Admitting: Physical Therapy

## 2015-09-24 ENCOUNTER — Ambulatory Visit: Payer: Commercial Managed Care - HMO | Admitting: Physical Therapy

## 2015-09-24 DIAGNOSIS — M25631 Stiffness of right wrist, not elsewhere classified: Secondary | ICD-10-CM

## 2015-09-24 DIAGNOSIS — M25621 Stiffness of right elbow, not elsewhere classified: Secondary | ICD-10-CM

## 2015-09-24 DIAGNOSIS — M25522 Pain in left elbow: Secondary | ICD-10-CM

## 2015-09-24 DIAGNOSIS — R5381 Other malaise: Secondary | ICD-10-CM

## 2015-09-24 NOTE — Patient Instructions (Signed)
Thumb Flexion: Resisted    With rubber band around right thumb, hold other end with other hand. Bend thumb toward palm with ball or putty in hand for resistance. Repeat __10__ times per set. Do _3___ sets per session. Do _2-3___ sessions per day.  Copyright  V[employer]. All rights reserved.  Finger Opposition    Actively touch right thumb to each fingertip. Start with index finger and proceed toward little finger. Move slowly at first, then more rapidly as motion and coordination improve. Be sure to touch each fingertip with ball or putty in hand. Repeat __10__ times per set. Do _3___ sets per session. Do _2-3___ sessions per day.  Copyright  VHI. All rights reserved.

## 2015-09-24 NOTE — Therapy (Signed)
[facility] [place], [place], [address] Phone: [phone]   Fax:  [phone]  Physical Therapy Treatment  Patient Details  Name: Ryan Jennings MRN: [id number] Date of Birth: 07/28/1961 Referring Provider: Elsie Saas MD.  Encounter Date: 09/24/2015      PT End of Session - 09/24/15 0815    Visit Number 18   Number of Visits 24   Date for PT Re-Evaluation 10/17/15   PT Start Time 0815   PT Stop Time 0856   PT Time Calculation (min) 41 min   Activity Tolerance Patient tolerated treatment well   Behavior During Therapy [facility] for tasks assessed/performed      Past Medical History  Diagnosis Date  . GERD (gastroesophageal reflux disease)   . Nephrolithiasis   . Esophageal ring     2010, 2012  . Adenomatous colon polyp 2010  . Hyperlipidemia   . PONV (postoperative nausea and vomiting)   . Traumatic rupture of right distal biceps tendon   . PONV (postoperative nausea and vomiting)     Past Surgical History  Procedure Laterality Date  . Appendectomy  8/11  . Rotator cuff repair      right  . Hernia repair      x 3  . Biceps tendon repair    . Esophagogastroduodenoscopy  04/2009    distal erosive reflux esophagitis, schatzki ring s/p 16F, hh, couple of antral erosions  . Colonoscopy  04/2009    sigmoid adenomatous polyp, next colonoscopy 04/2014  . Esophagogastroduodenoscopy  05/2011    Dr. [name]: erosive reflux esophagitis, superimposed on Schatzki's ring and early stricture s/p 48 F, small hiatal hernia  . Colonoscopy N/A 03/24/2014    Procedure: COLONOSCOPY;  Surgeon: [name], MD;  [facility];  Service: Endoscopy;  Laterality: N/A;  1:45-moved to [place] notified pt  . Distal biceps tendon repair Right 06/03/2015    Procedure: RIGHT DISTAL BICEPS TENDON REPAIR;  Surgeon: Elsie Saas, MD;  Location: [place];  Service: Orthopedics;  Laterality: Right;    There were  no vitals filed for this visit.  Visit Diagnosis:  Decreased range of motion of elbow, right  Debility  Wrist stiffness, right  Left elbow pain      Subjective Assessment - 09/24/15 0815    Subjective States that R thumb and hand a little swollen today but states that it gets better throughout the day but most noted in the morning. Reports same soreness in R elbow.   Patient Stated Goals regain use of right elbow.   Currently in Pain? Other (Comment)  Reports same soreness in R elbow as previously reported.            [facility] PT Assessment - 09/24/15 0001    Assessment   Medical Diagnosis Right distal biceps tendon repair.   Onset Date/Surgical Date 06/03/15   Next MD Visit 10/05/2015                     [facility] Adult PT Treatment/Exercise - 09/24/15 0001    Elbow Exercises   Elbow Flexion Strengthening;Right;Other reps (comment);Seated  x30 reps   Bar Weights/Barbell (Elbow Flexion) 5 lbs   Elbow Extension Strengthening;Both  x30 reps Pink XTS   Forearm Supination Strengthening;Right;Other reps (comment);Seated;Bar weights/barbell  5# x30 reps with holds at end range   Forearm Pronation Strengthening;Right;Other reps (comment);Seated;Bar weights/barbell  5# x30 reps   Wrist Flexion Strengthening;Right;Seated;Bar weights/barbell  x30  reps   Bar Weights/Barbell (Wrist Flexion) 5 lbs   Wrist Extension Strengthening;Right;Seated;Bar weights/barbell  x30 reps   Bar Weights/Barbell (Wrist Extension) 5 lbs   Other elbow exercises RUE ranger flexion/circles x30 reps   Other elbow exercises R red web grip x2 min   Shoulder Exercises: Standing   Other Standing Exercises 4# ball diagonal reaches 3x10 each way   Other Standing Exercises Standing wall pushups 3 x10   Shoulder Exercises: ROM/Strengthening   UBE (Upper Arm Bike) 90 RPMs x8 min forward only   Wrist Exercises   Wrist Radial Deviation Strengthening;Right;20 reps;Seated;Bar weights/barbell   Bar  Weights/Barbell (Radial Deviation) 5 lbs   Other wrist exercises Thumb flexion and finger opposition exercises from HEP with yellow putty x2 min   Manual Therapy   Manual Therapy Soft tissue mobilization;Passive ROM   Soft tissue mobilization STW to R distal bicep region and around incision to relieve tightness    Passive ROM Passive stretch to R wrist extensors, wrist flexors, forearm supinators to improve ROM                PT Education - 09/24/15 0836    Education provided Yes   Education Details HEP- grip exercises for thumb and finger oppositon   Person(s) Educated Patient   Methods Explanation;Demonstration;Verbal cues;Handout   Comprehension Verbalized understanding;Returned demonstration;Verbal cues required             PT Long Term Goals - 08/28/15 0948    PT LONG TERM GOAL #1   Title Ind with HEP.   Time 6   Period Weeks   Status Achieved   PT LONG TERM GOAL #2   Title Full right forearm supination.   Time 6   Period Weeks   Status On-going   PT LONG TERM GOAL #3   Title Full right elbow extension.   Period Weeks   Status Achieved   PT LONG TERM GOAL #4   Title Perform ADL's with pain not > 2-3/10.   Time 6   Period Weeks   Status Achieved   PT LONG TERM GOAL #5   Title Reduce right hand edema by 2 cms.   Period Weeks   Status Achieved               Plan - 09/24/15 0900    Clinical Impression Statement Patient continues to advance with resistance to exercises completed today with no reports of increased pain verbalized. Patient presented today with minimal increased edema in R hand that was most notable between thumb MTP and DIP. Patient continues to note decreased grip ability and has most difficulty with thumb opposition with 5th digit. Accepted HEP for grip and thumb strengthening with yellow putty without questions and verbalized understanding. During manual therapy it was noted of a small area of inflammation medial to R elbow incision  and that distal R Bicep not as tight as it has been previously.    Pt will benefit from skilled therapeutic intervention in order to improve on the following deficits Pain;Decreased activity tolerance;Increased edema;Decreased range of motion   Rehab Potential Excellent   PT Frequency 2x / week   PT Duration 8 weeks   PT Treatment/Interventions ADLs/Self Care Home Management;Cryotherapy;Electrical Stimulation;Moist Heat;Ultrasound;Therapeutic activities;Therapeutic exercise;Manual techniques;Passive range of motion   PT Next Visit Plan continue ROM, strengthening per protocol.      PT Home Exercise Plan isometrics shoulder IR/ER/ABD/ext; thumb and grip strengtheing with yellow putty   Consulted and Agree with Plan of  Care Patient        Problem List Patient Active Problem List   Diagnosis Date Noted  . Traumatic rupture of right distal biceps tendon   . PONV (postoperative nausea and vomiting)   . Adenomatous colon polyp 03/17/2014  . Esophageal dysphagia 05/06/2011  . GERD (gastroesophageal reflux disease) 05/06/2011    Wynelle Fanny, PTA 09/24/2015, 9:10 AM  [facility] [place], [place], [address] Phone: [phone]   Fax:  [phone]  Name: Ryan Jennings MRN: [id number] Date of Birth: [birth year]

## 2015-09-29 ENCOUNTER — Ambulatory Visit: Payer: Commercial Managed Care - HMO | Admitting: *Deleted

## 2015-09-29 ENCOUNTER — Encounter: Payer: Self-pay | Admitting: *Deleted

## 2015-09-29 DIAGNOSIS — R5381 Other malaise: Secondary | ICD-10-CM

## 2015-09-29 DIAGNOSIS — M25621 Stiffness of right elbow, not elsewhere classified: Secondary | ICD-10-CM

## 2015-09-29 DIAGNOSIS — M25631 Stiffness of right wrist, not elsewhere classified: Secondary | ICD-10-CM

## 2015-09-29 DIAGNOSIS — M25522 Pain in left elbow: Secondary | ICD-10-CM

## 2015-09-29 NOTE — Therapy (Signed)
[facility] [place], [place], [address] Phone: [phone]   Fax:  [phone]  Physical Therapy Treatment  Patient Details  Name: Ryan Jennings MRN: [id number] Date of Birth: [birth year] Referring Provider: Elsie Saas MD.  Encounter Date: 09/29/2015      PT End of Session - 09/29/15 [id number]    Visit Number 19   Number of Visits 24   Date for PT Re-Evaluation 10/17/15   PT Start Time 0815   PT Stop Time 0901   PT Time Calculation (min) 46 min      Past Medical History  Diagnosis Date  . GERD (gastroesophageal reflux disease)   . Nephrolithiasis   . Esophageal ring     2010, 2012  . Adenomatous colon polyp 2010  . Hyperlipidemia   . PONV (postoperative nausea and vomiting)   . Traumatic rupture of right distal biceps tendon   . PONV (postoperative nausea and vomiting)     Past Surgical History  Procedure Laterality Date  . Appendectomy  8/11  . Rotator cuff repair      right  . Hernia repair      x 3  . Biceps tendon repair    . Esophagogastroduodenoscopy  04/2009    distal erosive reflux esophagitis, schatzki ring s/p 106F, hh, couple of antral erosions  . Colonoscopy  04/2009    sigmoid adenomatous polyp, next colonoscopy 04/2014  . Esophagogastroduodenoscopy  05/2011    Dr. [name]: erosive reflux esophagitis, superimposed on Schatzki's ring and early stricture s/p 45 F, small hiatal hernia  . Colonoscopy N/A 03/24/2014    Procedure: COLONOSCOPY;  Surgeon: [name], MD;  [facility];  Service: Endoscopy;  Laterality: N/A;  1:45-moved to [place] notified pt  . Distal biceps tendon repair Right 06/03/2015    Procedure: RIGHT DISTAL BICEPS TENDON REPAIR;  Surgeon: Elsie Saas, MD;  Location: [place];  Service: Orthopedics;  Laterality: Right;    There were no vitals filed for this visit.  Visit Diagnosis:  Decreased range of motion of elbow,  right  Debility  Wrist stiffness, right  Left elbow pain      Subjective Assessment - 09/29/15 0821    Subjective States that R thumb and hand a little swollen today but states that it gets better throughout the day but most noted in the morning. Reports same soreness in R elbow.   Patient Stated Goals regain use of right elbow.   Currently in Pain? Yes   Pain Score 2    Pain Location Elbow   Pain Orientation Right   Pain Descriptors / Indicators Sore   Pain Type Surgical pain   Pain Onset More than a month ago   Pain Frequency Intermittent   Aggravating Factors  cold weather                         [facility] Adult PT Treatment/Exercise - 09/29/15 0001    Exercises   Exercises Shoulder;Elbow;Wrist   Elbow Exercises   Elbow Flexion Strengthening;Right;Other reps (comment);Seated   Bar Weights/Barbell (Elbow Flexion) 5 lbs   Elbow Extension Strengthening;Both  x30 reps Pink XTS   Forearm Supination Strengthening;Right;Other reps (comment);Seated;Bar weights/barbell  5# x30 reps with holds at end range   Forearm Pronation Strengthening;Right;Other reps (comment);Seated;Bar weights/barbell  5# x30 reps   Wrist Flexion Strengthening;Right;Seated;Bar weights/barbell  x30 reps   Bar Weights/Barbell (Wrist Flexion) 5 lbs   Wrist  Extension Strengthening;Right;Seated;Bar weights/barbell  x30 reps   Bar Weights/Barbell (Wrist Extension) 5 lbs   Other elbow exercises RUE ranger flexion/circles x30 reps   Other elbow exercises R red web grip x2 min   Shoulder Exercises: Standing   Other Standing Exercises 4# ball diagonal reaches 3x10 each way   Other Standing Exercises Standing wall pushups 3 x10   Shoulder Exercises: ROM/Strengthening   UBE (Upper Arm Bike) 90 RPMs x10  min forward/Retro                                                                                                    ROWS  XTS PINK band   3x10                 PT Long Term Goals -  08/28/15 0948    PT LONG TERM GOAL #1   Title Ind with HEP.   Time 6   Period Weeks   Status Achieved   PT LONG TERM GOAL #2   Title Full right forearm supination.   Time 6   Period Weeks   Status On-going   PT LONG TERM GOAL #3   Title Full right elbow extension.   Period Weeks   Status Achieved   PT LONG TERM GOAL #4   Title Perform ADL's with pain not > 2-3/10.   Time 6   Period Weeks   Status Achieved   PT LONG TERM GOAL #5   Title Reduce right hand edema by 2 cms.   Period Weeks   Status Achieved               Plan - 09/29/15 [id number]    Clinical Impression Statement Pt did great today and was able to complete exs and act.'s for RT Elbow/UE. He remains a little sore in RT distal bicep and numbness in Thumb and first digit. TO MD on 10-05-15   Pt will benefit from skilled therapeutic intervention in order to improve on the following deficits Pain;Decreased activity tolerance;Increased edema;Decreased range of motion   Rehab Potential Excellent   PT Frequency 2x / week   PT Duration 8 weeks   PT Treatment/Interventions ADLs/Self Care Home Management;Cryotherapy;Electrical Stimulation;Moist Heat;Ultrasound;Therapeutic activities;Therapeutic exercise;Manual techniques;Passive range of motion   PT Next Visit Plan continue ROM, strengthening per protocol.     MD note next visit   PT Home Exercise Plan isometrics shoulder IR/ER/ABD/ext; thumb and grip strengtheing with yellow putty   Consulted and Agree with Plan of Care Patient        Problem List Patient Active Problem List   Diagnosis Date Noted  . Traumatic rupture of right distal biceps tendon   . PONV (postoperative nausea and vomiting)   . Adenomatous colon polyp 03/17/2014  . Esophageal dysphagia 05/06/2011  . GERD (gastroesophageal reflux disease) 05/06/2011    Andreina Outten,CHRIS, PTA 09/29/2015, 9:14 AM  [facility] [place], [place], [address] Phone:  [phone]   Fax:  [phone]  Name: Ryan Jennings MRN: [id number] Date of Birth: 02/20/1961

## 2015-10-01 ENCOUNTER — Ambulatory Visit: Payer: Commercial Managed Care - HMO | Admitting: *Deleted

## 2015-10-01 DIAGNOSIS — R5381 Other malaise: Secondary | ICD-10-CM

## 2015-10-01 DIAGNOSIS — M25621 Stiffness of right elbow, not elsewhere classified: Secondary | ICD-10-CM

## 2015-10-01 DIAGNOSIS — M25631 Stiffness of right wrist, not elsewhere classified: Secondary | ICD-10-CM

## 2015-10-01 NOTE — Therapy (Addendum)
[facility] [place], [place], [address] Phone: [phone]   Fax:  [phone]  Physical Therapy Treatment  Patient Details  Name: Ryan Jennings MRN: [id number] Date of Birth: 08-Aug-1961 Referring Provider: Elsie Saas MD.  Encounter Date: 10/01/2015      PT End of Session - 10/01/15 0820    Visit Number 20   Number of Visits 24   Date for PT Re-Evaluation 10/17/15   PT Start Time 0815   PT Stop Time 0910   PT Time Calculation (min) 55 min      Past Medical History  Diagnosis Date  . GERD (gastroesophageal reflux disease)   . Nephrolithiasis   . Esophageal ring     2010, 2012  . Adenomatous colon polyp 2010  . Hyperlipidemia   . PONV (postoperative nausea and vomiting)   . Traumatic rupture of right distal biceps tendon   . PONV (postoperative nausea and vomiting)     Past Surgical History  Procedure Laterality Date  . Appendectomy  8/11  . Rotator cuff repair      right  . Hernia repair      x 3  . Biceps tendon repair    . Esophagogastroduodenoscopy  04/2009    distal erosive reflux esophagitis, schatzki ring s/p 71F, hh, couple of antral erosions  . Colonoscopy  04/2009    sigmoid adenomatous polyp, next colonoscopy 04/2014  . Esophagogastroduodenoscopy  05/2011    Dr. [name]: erosive reflux esophagitis, superimposed on Schatzki's ring and early stricture s/p 59 F, small hiatal hernia  . Colonoscopy N/A 03/24/2014    Procedure: COLONOSCOPY;  Surgeon: [name], MD;  [facility];  Service: Endoscopy;  Laterality: N/A;  1:45-moved to [place] notified pt  . Distal biceps tendon repair Right 06/03/2015    Procedure: RIGHT DISTAL BICEPS TENDON REPAIR;  Surgeon: Elsie Saas, MD;  Location: [place];  Service: Orthopedics;  Laterality: Right;    There were no vitals filed for this visit.  Visit Diagnosis:  Decreased range of motion of elbow,  right  Debility  Wrist stiffness, right      Subjective Assessment - 10/01/15 0821    Subjective States that R thumb and hand a little swollen today but states that it gets better throughout the day but most noted in the morning. Reports same soreness in R elbow. A little sorer after last Rx.   Patient Stated Goals regain use of right elbow.   Currently in Pain? Yes   Pain Score 3    Pain Location Elbow   Pain Orientation Right   Pain Descriptors / Indicators Sore   Pain Type Surgical pain   Pain Onset More than a month ago   Pain Frequency Intermittent   Aggravating Factors  cold weather   Pain Relieving Factors PT Rxs                         [facility] Adult PT Treatment/Exercise - 10/01/15 0001    Exercises   Exercises Shoulder;Elbow;Wrist   Elbow Exercises   Elbow Flexion Strengthening;Right;Other reps (comment);Seated   Bar Weights/Barbell (Elbow Flexion) 5 lbs   Elbow Extension Strengthening;Both  x30 reps Pink XTS   Forearm Supination Strengthening;Right;Other reps (comment);Seated;Bar weights/barbell  5# x30 reps with holds at end range   Forearm Pronation Strengthening;Right;Other reps (comment);Seated;Bar weights/barbell  5# x30 reps   Wrist Flexion Strengthening;Right;Seated;Bar weights/barbell  x30 reps  Bar Weights/Barbell (Wrist Flexion) 5 lbs   Wrist Extension Strengthening;Right;Seated;Bar weights/barbell  x30 reps   Bar Weights/Barbell (Wrist Extension) 5 lbs   Other elbow exercises RUE ranger flexion/circles x30 reps   Other elbow exercises R red web grip x2 min   Shoulder Exercises: Standing   Row Strengthening;Both;20 reps;10 reps  XTS pink 3x10   Other Standing Exercises 4# ball diagonal reaches 3x10 each way   Other Standing Exercises Standing wall pushups 3 x10   Shoulder Exercises: ROM/Strengthening   UBE (Upper Arm Bike) 90 RPMs x10  min forward/Retro    Manual Therapy   Soft tissue mobilization STW/TM  to R distal bicep region  and around incision to relieve tightness                      PT Long Term Goals - 10/01/15 0837    PT LONG TERM GOAL #1   Title Ind with HEP.   Time 6   Period Weeks   Status Achieved   PT LONG TERM GOAL #2   Title Full right forearm supination.  NM yet due to tightness. -5 dgrees away   Time 6   Period Weeks   Status On-going   PT LONG TERM GOAL #3   Title Full right elbow extension.   Time 6   Period Weeks   Status Achieved   PT LONG TERM GOAL #4   Title Perform ADL's with pain not > 2-3/10.   PT LONG TERM GOAL #5   Title Reduce right hand edema by 2 cms.   Time 6   Period Weeks   Status Achieved               Plan - 10/01/15 2671    Clinical Impression Statement Pt has progressed well with ROM and strength and has Met majority of Goals. He still lacks ROM in supination still and has strength deficits in RT UE. We have progressed to 5#s in PREs. Pt F/U with MD on 10-05-15   Pt will benefit from skilled therapeutic intervention in order to improve on the following deficits Pain;Decreased activity tolerance;Increased edema;Decreased range of motion   Rehab Potential Excellent   PT Frequency 2x / week   PT Duration 8 weeks   PT Treatment/Interventions ADLs/Self Care Home Management;Cryotherapy;Electrical Stimulation;Moist Heat;Ultrasound;Therapeutic activities;Therapeutic exercise;Manual techniques;Passive range of motion   PT Next Visit Plan continue ROM, strengthening per protocol.     MD note routed to MPT and await for MD Order   PT Home Exercise Plan isometrics shoulder IR/ER/ABD/ext; thumb and grip strengtheing with yellow putty   Consulted and Agree with Plan of Care Patient        Problem List Patient Active Problem List   Diagnosis Date Noted  . Traumatic rupture of right distal biceps tendon   . PONV (postoperative nausea and vomiting)   . Adenomatous colon polyp 03/17/2014  . Esophageal dysphagia 05/06/2011  . GERD (gastroesophageal  reflux disease) 05/06/2011    [place], [place], MPT 10/01/2015, 12:42 PM [place] App[place] MPT [facility] [place], [place], [address] Phone: [phone]   Fax:  [phone]  Name: Ryan Jennings MRN: [id number] Date of Birth: [birth year]

## 2015-10-06 ENCOUNTER — Ambulatory Visit: Payer: Commercial Managed Care - HMO | Admitting: *Deleted

## 2015-10-06 DIAGNOSIS — R5381 Other malaise: Secondary | ICD-10-CM

## 2015-10-06 DIAGNOSIS — M25522 Pain in left elbow: Secondary | ICD-10-CM

## 2015-10-06 DIAGNOSIS — M25621 Stiffness of right elbow, not elsewhere classified: Secondary | ICD-10-CM | POA: Diagnosis not present

## 2015-10-06 DIAGNOSIS — M25631 Stiffness of right wrist, not elsewhere classified: Secondary | ICD-10-CM

## 2015-10-06 NOTE — Therapy (Signed)
[facility] [place], [place], [address] Phone: [phone]   Fax:  [phone]  Physical Therapy Treatment  Patient Details  Name: NICODEMUS SALMI MRN: [id number] Date of Birth: [birth year] Referring Provider: Elsie Saas MD.  Encounter Date: 10/06/2015      PT End of Session - 10/06/15 [id number]    Visit Number 21   Number of Visits 24   Date for PT Re-Evaluation 10/17/15   PT Start Time 0815   PT Stop Time 0905   PT Time Calculation (min) 50 min      Past Medical History  Diagnosis Date  . GERD (gastroesophageal reflux disease)   . Nephrolithiasis   . Esophageal ring     2010, 2012  . Adenomatous colon polyp 2010  . Hyperlipidemia   . PONV (postoperative nausea and vomiting)   . Traumatic rupture of right distal biceps tendon   . PONV (postoperative nausea and vomiting)     Past Surgical History  Procedure Laterality Date  . Appendectomy  8/11  . Rotator cuff repair      right  . Hernia repair      x 3  . Biceps tendon repair    . Esophagogastroduodenoscopy  04/2009    distal erosive reflux esophagitis, schatzki ring s/p 25F, hh, couple of antral erosions  . Colonoscopy  04/2009    sigmoid adenomatous polyp, next colonoscopy 04/2014  . Esophagogastroduodenoscopy  05/2011    Dr. [name]: erosive reflux esophagitis, superimposed on Schatzki's ring and early stricture s/p 43 F, small hiatal hernia  . Colonoscopy N/A 03/24/2014    Procedure: COLONOSCOPY;  Surgeon: [name], MD;  [facility];  Service: Endoscopy;  Laterality: N/A;  1:45-moved to [place] notified pt  . Distal biceps tendon repair Right 06/03/2015    Procedure: RIGHT DISTAL BICEPS TENDON REPAIR;  Surgeon: Elsie Saas, MD;  Location: [place];  Service: Orthopedics;  Laterality: Right;    There were no vitals filed for this visit.  Visit Diagnosis:  Decreased range of motion of elbow,  right  Debility  Wrist stiffness, right  Left elbow pain      Subjective Assessment - 10/06/15 0820    Subjective RT thumb is doing better with less numbness now. Went to see MD yesterday and he said to finish up current PT visits and do HEP   Patient Stated Goals regain use of right elbow.   Currently in Pain? Yes   Pain Score 3    Pain Location Elbow   Pain Orientation Right   Pain Descriptors / Indicators Sore   Pain Type Surgical pain   Pain Onset More than a month ago   Pain Frequency Intermittent                         [facility] Adult PT Treatment/Exercise - 10/06/15 0001    Exercises   Exercises Shoulder;Elbow;Wrist   Elbow Exercises   Elbow Flexion Strengthening;Right;Other reps (comment);Seated   Bar Weights/Barbell (Elbow Flexion) --  6#   Elbow Extension Strengthening;Both   Forearm Supination Strengthening;Right;Other reps (comment);Seated;Bar weights/barbell  6# x30 reps with holds at end range   Forearm Pronation Strengthening;Right;Other reps (comment);Seated;Bar weights/barbell  6# x30 reps   Wrist Flexion Strengthening;Right;Seated;Bar weights/barbell  x30 reps   Bar Weights/Barbell (Wrist Flexion) Other (comment)  6#   Wrist Extension Strengthening;Right;Seated;Bar weights/barbell   Bar Weights/Barbell (Wrist Extension) --  6#  Other elbow exercises RUE ranger flexion/circles x30 reps   Other elbow exercises R red web grip x2 min   Shoulder Exercises: Standing   Row Strengthening;Both;20 reps;10 reps  XTS pink 3x10   Other Standing Exercises 4# and 10# ball diagonal reaches 3x10 each way   Other Standing Exercises Standing wall pushups 3 x10                     PT Long Term Goals - 10/01/15 0837    PT LONG TERM GOAL #1   Title Ind with HEP.   Time 6   Period Weeks   Status Achieved   PT LONG TERM GOAL #2   Title Full right forearm supination.  NM yet due to tightness. -5 dgrees away   Time 6   Period Weeks    Status On-going   PT LONG TERM GOAL #3   Title Full right elbow extension.   Time 6   Period Weeks   Status Achieved   PT LONG TERM GOAL #4   Title Perform ADL's with pain not > 2-3/10.   PT LONG TERM GOAL #5   Title Reduce right hand edema by 2 cms.   Time 6   Period Weeks   Status Achieved               Plan - 10/06/15 [id number]    Clinical Impression Statement Pt F/U with MD went well and told Pt to finish up with current PT visits and DC to HEP. Pt did well with Rx today and was able to progress with PREs today.   Pt will benefit from skilled therapeutic intervention in order to improve on the following deficits Pain;Decreased activity tolerance;Increased edema;Decreased range of motion   Rehab Potential Excellent   PT Frequency 2x / week   PT Duration 8 weeks   PT Next Visit Plan continue ROM, strengthening per protocol.     DC after 3 more visits as per MD   Consulted and Agree with Plan of Care Patient        Problem List Patient Active Problem List   Diagnosis Date Noted  . Traumatic rupture of right distal biceps tendon   . PONV (postoperative nausea and vomiting)   . Adenomatous colon polyp 03/17/2014  . Esophageal dysphagia 05/06/2011  . GERD (gastroesophageal reflux disease) 05/06/2011    Sebastiano Luecke,CHRIS, PTA 10/06/2015, 9:12 AM  [facility] [address] [place], [place], [address] Phone: [phone]   Fax:  [phone]  Name: ALYJAH HIRSCHMAN MRN: [id number] Date of Birth: 19-Mar-1961

## 2015-10-08 ENCOUNTER — Encounter: Payer: Self-pay | Admitting: *Deleted

## 2015-10-08 ENCOUNTER — Ambulatory Visit: Payer: Commercial Managed Care - HMO | Attending: Orthopedic Surgery | Admitting: *Deleted

## 2015-10-08 DIAGNOSIS — M25621 Stiffness of right elbow, not elsewhere classified: Secondary | ICD-10-CM | POA: Diagnosis not present

## 2015-10-08 DIAGNOSIS — R5381 Other malaise: Secondary | ICD-10-CM | POA: Insufficient documentation

## 2015-10-08 DIAGNOSIS — M25631 Stiffness of right wrist, not elsewhere classified: Secondary | ICD-10-CM | POA: Diagnosis present

## 2015-10-08 DIAGNOSIS — M25522 Pain in left elbow: Secondary | ICD-10-CM | POA: Insufficient documentation

## 2015-10-08 NOTE — Therapy (Signed)
[facility] [place], [place], [address] Phone: [phone]   Fax:  [phone]  Physical Therapy Treatment  Patient Details  Name: Ryan Jennings MRN: [id number] Date of Birth: [birth year] Referring Provider: Elsie Saas MD.  Encounter Date: 10/08/2015      PT End of Session - 10/08/15 0837    Visit Number 22   Number of Visits 24   Date for PT Re-Evaluation 10/17/15   PT Start Time 0816   PT Stop Time 0905   PT Time Calculation (min) 49 min      Past Medical History  Diagnosis Date  . GERD (gastroesophageal reflux disease)   . Nephrolithiasis   . Esophageal ring     2010, 2012  . Adenomatous colon polyp 2010  . Hyperlipidemia   . PONV (postoperative nausea and vomiting)   . Traumatic rupture of right distal biceps tendon   . PONV (postoperative nausea and vomiting)     Past Surgical History  Procedure Laterality Date  . Appendectomy  8/11  . Rotator cuff repair      right  . Hernia repair      x 3  . Biceps tendon repair    . Esophagogastroduodenoscopy  04/2009    distal erosive reflux esophagitis, schatzki ring s/p 46F, hh, couple of antral erosions  . Colonoscopy  04/2009    sigmoid adenomatous polyp, next colonoscopy 04/2014  . Esophagogastroduodenoscopy  05/2011    Dr. [name]: erosive reflux esophagitis, superimposed on Schatzki's ring and early stricture s/p 25 F, small hiatal hernia  . Colonoscopy N/A 03/24/2014    Procedure: COLONOSCOPY;  Surgeon: [name], MD;  [facility];  Service: Endoscopy;  Laterality: N/A;  1:45-moved to [place] notified pt  . Distal biceps tendon repair Right 06/03/2015    Procedure: RIGHT DISTAL BICEPS TENDON REPAIR;  Surgeon: Elsie Saas, MD;  Location: [place];  Service: Orthopedics;  Laterality: Right;    There were no vitals filed for this visit.  Visit Diagnosis:  Decreased range of motion of elbow, right  Debility  Wrist  stiffness, right  Left elbow pain      Subjective Assessment - 10/08/15 0824    Subjective RT thumb is doing better with less numbness now. MD  said to finish up current PT visits and do HEP   Patient Stated Goals regain use of right elbow.   Currently in Pain? Yes   Pain Score 3    Pain Location Elbow   Pain Orientation Right   Pain Type Surgical pain   Pain Onset More than a month ago   Pain Frequency Intermittent   Aggravating Factors  cold weather   Pain Relieving Factors PT                         [facility] Adult PT Treatment/Exercise - 10/08/15 0001    Exercises   Exercises Shoulder;Elbow;Wrist   Elbow Exercises   Elbow Flexion Strengthening;Right;Other reps (comment);Seated   Bar Weights/Barbell (Elbow Flexion) --  6#   Elbow Extension Strengthening;Both  x30 reps Pink XTS   Forearm Supination Strengthening;Right;Other reps (comment);Seated;Bar weights/barbell  6# x30 reps with holds at end range   Forearm Pronation Strengthening;Right;Other reps (comment);Seated;Bar weights/barbell  6# x30 reps   Wrist Flexion Strengthening;Right;Seated;Bar weights/barbell  x30 reps   Bar Weights/Barbell (Wrist Flexion) Other (comment)  6#   Wrist Extension Strengthening;Right;Seated;Bar Teacher, English as a foreign language (Wrist  Extension) --  6#   Other elbow exercises RUE ranger flexion/circles x30 reps   Other elbow exercises R red web grip x2 min   Shoulder Exercises: Standing   Row Strengthening;Both;20 reps;10 reps  XTS pink 3x10   Other Standing Exercises 4# and 10# ball diagonal reaches 3x10 each way   Other Standing Exercises Standing wall pushups 3 x10   Shoulder Exercises: ROM/Strengthening   UBE (Upper Arm Bike) 90 RPMs x10  min forward/Retro                      PT Long Term Goals - 10/01/15 [id number]    PT LONG TERM GOAL #1   Title Ind with HEP.   Time 6   Period Weeks   Status Achieved   PT LONG TERM GOAL #2   Title Full right  forearm supination.  NM yet due to tightness. -5 dgrees away   Time 6   Period Weeks   Status On-going   PT LONG TERM GOAL #3   Title Full right elbow extension.   Time 6   Period Weeks   Status Achieved   PT LONG TERM GOAL #4   Title Perform ADL's with pain not > 2-3/10.   PT LONG TERM GOAL #5   Title Reduce right hand edema by 2 cms.   Time 6   Period Weeks   Status Achieved               Plan - 10/08/15 [id number]    Clinical Impression Statement Pt did great with Rx today and was able to perform all Exs with minimalPain. He C/O mainly soreness in RT elbow.Continue towards goals and DC next week.   Pt will benefit from skilled therapeutic intervention in order to improve on the following deficits Pain;Decreased activity tolerance;Increased edema;Decreased range of motion   Rehab Potential Excellent   PT Frequency 2x / week   PT Duration 8 weeks   PT Treatment/Interventions ADLs/Self Care Home Management;Cryotherapy;Electrical Stimulation;Moist Heat;Ultrasound;Therapeutic activities;Therapeutic exercise;Manual techniques;Passive range of motion   PT Next Visit Plan continue ROM, strengthening per protocol.     DC after 3 more visits as per MD   PT Home Exercise Plan isometrics shoulder IR/ER/ABD/ext; thumb and grip strengtheing with yellow putty   Consulted and Agree with Plan of Care Patient        Problem List Patient Active Problem List   Diagnosis Date Noted  . Traumatic rupture of right distal biceps tendon   . PONV (postoperative nausea and vomiting)   . Adenomatous colon polyp 03/17/2014  . Esophageal dysphagia 05/06/2011  . GERD (gastroesophageal reflux disease) 05/06/2011    RAMSEUR,CHRIS, PTA 10/08/2015, 9:13 AM  [facility] [place], [place], [address] Phone: [phone]   Fax:  [phone]  Name: JARRICK HENRICHS MRN: [id number] Date of Birth: 1960/09/25

## 2015-10-13 ENCOUNTER — Ambulatory Visit: Payer: Commercial Managed Care - HMO | Admitting: *Deleted

## 2015-10-13 ENCOUNTER — Encounter: Payer: Self-pay | Admitting: *Deleted

## 2015-10-13 DIAGNOSIS — M25621 Stiffness of right elbow, not elsewhere classified: Secondary | ICD-10-CM | POA: Diagnosis not present

## 2015-10-13 DIAGNOSIS — R5381 Other malaise: Secondary | ICD-10-CM

## 2015-10-13 DIAGNOSIS — M25631 Stiffness of right wrist, not elsewhere classified: Secondary | ICD-10-CM

## 2015-10-13 DIAGNOSIS — M25522 Pain in left elbow: Secondary | ICD-10-CM

## 2015-10-13 NOTE — Therapy (Signed)
[facility] [place], [place], [address] Phone: [phone]   Fax:  [phone]  Physical Therapy Treatment  Patient Details  Name: Ryan Jennings MRN: [id number] Date of Birth: December 01, 1960 Referring Provider: Elsie Saas MD.  Encounter Date: 10/13/2015      PT End of Session - 10/13/15 0930    Visit Number 23   Number of Visits 24   Date for PT Re-Evaluation 10/17/15   PT Start Time 0907   PT Stop Time 0956   PT Time Calculation (min) 49 min      Past Medical History  Diagnosis Date  . GERD (gastroesophageal reflux disease)   . Nephrolithiasis   . Esophageal ring     2010, 2012  . Adenomatous colon polyp 2010  . Hyperlipidemia   . PONV (postoperative nausea and vomiting)   . Traumatic rupture of right distal biceps tendon   . PONV (postoperative nausea and vomiting)     Past Surgical History  Procedure Laterality Date  . Appendectomy  8/11  . Rotator cuff repair      right  . Hernia repair      x 3  . Biceps tendon repair    . Esophagogastroduodenoscopy  04/2009    distal erosive reflux esophagitis, schatzki ring s/p 67F, hh, couple of antral erosions  . Colonoscopy  04/2009    sigmoid adenomatous polyp, next colonoscopy 04/2014  . Esophagogastroduodenoscopy  05/2011    Dr. [name]: erosive reflux esophagitis, superimposed on Schatzki's ring and early stricture s/p 70 F, small hiatal hernia  . Colonoscopy N/A 03/24/2014    Procedure: COLONOSCOPY;  Surgeon: [name], MD;  [facility];  Service: Endoscopy;  Laterality: N/A;  1:45-moved to [place] notified pt  . Distal biceps tendon repair Right 06/03/2015    Procedure: RIGHT DISTAL BICEPS TENDON REPAIR;  Surgeon: Elsie Saas, MD;  Location: [place];  Service: Orthopedics;  Laterality: Right;    There were no vitals filed for this visit.  Visit Diagnosis:  Decreased range of motion of elbow, right  Debility  Wrist  stiffness, right  Left elbow pain      Subjective Assessment - 10/13/15 0919    Subjective RT thumb is doing better with less numbness now. MD  said to finish up current PT visits and do HEP   Patient Stated Goals regain use of right elbow.   Currently in Pain? Yes   Pain Score 3    Pain Location Elbow   Pain Orientation Right   Pain Descriptors / Indicators Sore   Pain Type Surgical pain   Pain Onset More than a month ago   Pain Frequency Intermittent                         [facility] Adult PT Treatment/Exercise - 10/13/15 0001    Exercises   Exercises Shoulder;Elbow;Wrist   Elbow Exercises   Elbow Flexion Strengthening;Right;Other reps (comment);Seated   Bar Weights/Barbell (Elbow Flexion) --  7#   Elbow Extension Strengthening;Both  x30 reps Pink XTS   Forearm Supination Strengthening;Right;Other reps (comment);Seated;Bar weights/barbell  7# x30 reps with holds at end range   Forearm Pronation Strengthening;Right;Other reps (comment);Seated;Bar weights/barbell  7# x30 reps   Wrist Flexion Strengthening;Right;Seated;Bar weights/barbell  x30 reps   Bar Weights/Barbell (Wrist Flexion) Other (comment)  7#   Other elbow exercises RUE ranger flexion/circles x30 reps   Other elbow exercises R red  web grip x2 min   Shoulder Exercises: Standing   Row Strengthening;Both;20 reps;10 reps  XTS pink 3x10   Other Standing Exercises 4# and 10# ball diagonal reaches 3x10 each way   Other Standing Exercises Standing wall pushups 3 x10   Shoulder Exercises: ROM/Strengthening   UBE (Upper Arm Bike) 90 RPMs x10  min forward/Retro    Manual Therapy   Manual Therapy Soft tissue mobilization;Passive ROM   Manual therapy comments STW/M to patient's right Biceps and incisional site while receiving gentle PROM to patient's R elbow into extension and right forearm supination / Pronation. RT shldr isometrics all planes   Passive ROM Passive stretch to R wrist extensors, wrist  flexors, forearm supinators to improve ROM                     PT Long Term Goals - 10/01/15 0837    PT LONG TERM GOAL #1   Title Ind with HEP.   Time 6   Period Weeks   Status Achieved   PT LONG TERM GOAL #2   Title Full right forearm supination.  NM yet due to tightness. -5 dgrees away   Time 6   Period Weeks   Status On-going   PT LONG TERM GOAL #3   Title Full right elbow extension.   Time 6   Period Weeks   Status Achieved   PT LONG TERM GOAL #4   Title Perform ADL's with pain not > 2-3/10.   PT LONG TERM GOAL #5   Title Reduce right hand edema by 2 cms.   Time 6   Period Weeks   Status Achieved               Plan - 10/13/15 0934    Clinical Impression Statement Pt did great again today and was able to progress to 7#s on PREs. DC next visit.   Pt will benefit from skilled therapeutic intervention in order to improve on the following deficits Pain;Decreased activity tolerance;Increased edema;Decreased range of motion   Rehab Potential Excellent   PT Frequency 2x / week   PT Duration 8 weeks   PT Treatment/Interventions ADLs/Self Care Home Management;Cryotherapy;Electrical Stimulation;Moist Heat;Ultrasound;Therapeutic activities;Therapeutic exercise;Manual techniques;Passive range of motion   PT Next Visit Plan continue ROM, strengthening per protocol.     DC next visit   Consulted and Agree with Plan of Care Patient        Problem List Patient Active Problem List   Diagnosis Date Noted  . Traumatic rupture of right distal biceps tendon   . PONV (postoperative nausea and vomiting)   . Adenomatous colon polyp 03/17/2014  . Esophageal dysphagia 05/06/2011  . GERD (gastroesophageal reflux disease) 05/06/2011    Kieli Golladay,CHRIS, PTA 10/13/2015, 9:57 AM  [facility] [place], [place], [address] Phone: [phone]   Fax:  [phone]  Name: Ryan Jennings MRN: [id number] Date of  Birth: Jul 07, 1961

## 2015-10-15 ENCOUNTER — Encounter: Payer: Self-pay | Admitting: *Deleted

## 2015-10-15 ENCOUNTER — Ambulatory Visit: Payer: Commercial Managed Care - HMO | Admitting: *Deleted

## 2015-10-15 DIAGNOSIS — M25621 Stiffness of right elbow, not elsewhere classified: Secondary | ICD-10-CM | POA: Diagnosis not present

## 2015-10-15 DIAGNOSIS — M25522 Pain in left elbow: Secondary | ICD-10-CM

## 2015-10-15 DIAGNOSIS — M25631 Stiffness of right wrist, not elsewhere classified: Secondary | ICD-10-CM

## 2015-10-15 DIAGNOSIS — R5381 Other malaise: Secondary | ICD-10-CM

## 2015-10-15 NOTE — Therapy (Addendum)
[facility] [place], [place], [address] Phone: [phone]   Fax:  [phone]  Physical Therapy Treatment  Patient Details  Name: Ryan Jennings MRN: [id number] Date of Birth: 08-26-1961 Referring Provider: Elsie Saas MD.  Encounter Date: 10/15/2015      PT End of Session - 10/15/15 0937    Visit Number 24   Date for PT Re-Evaluation 10/17/15   PT Start Time 0818   PT Stop Time 0907   PT Time Calculation (min) 49 min      Past Medical History  Diagnosis Date  . GERD (gastroesophageal reflux disease)   . Nephrolithiasis   . Esophageal ring     2010, 2012  . Adenomatous colon polyp 2010  . Hyperlipidemia   . PONV (postoperative nausea and vomiting)   . Traumatic rupture of right distal biceps tendon   . PONV (postoperative nausea and vomiting)     Past Surgical History  Procedure Laterality Date  . Appendectomy  8/11  . Rotator cuff repair      right  . Hernia repair      x 3  . Biceps tendon repair    . Esophagogastroduodenoscopy  04/2009    distal erosive reflux esophagitis, schatzki ring s/p 59F, hh, couple of antral erosions  . Colonoscopy  04/2009    sigmoid adenomatous polyp, next colonoscopy 04/2014  . Esophagogastroduodenoscopy  05/2011    Dr. [name]: erosive reflux esophagitis, superimposed on Schatzki's ring and early stricture s/p 74 F, small hiatal hernia  . Colonoscopy N/A 03/24/2014    Procedure: COLONOSCOPY;  Surgeon: [name], MD;  [facility];  Service: Endoscopy;  Laterality: N/A;  1:45-moved to [place] notified pt  . Distal biceps tendon repair Right 06/03/2015    Procedure: RIGHT DISTAL BICEPS TENDON REPAIR;  Surgeon: Elsie Saas, MD;  Location: [place];  Service: Orthopedics;  Laterality: Right;    There were no vitals filed for this visit.  Visit Diagnosis:  Decreased range of motion of elbow, right  Debility  Wrist stiffness,  right  Left elbow pain      Subjective Assessment - 10/15/15 0821    Subjective RT thumb is doing better with less numbness now. MD  said to finish up current PT visits and do HEP. DC today   Patient Stated Goals regain use of right elbow.   Currently in Pain? Yes   Pain Score 2    Pain Location Elbow   Pain Orientation Right   Pain Descriptors / Indicators Sore   Pain Type Surgical pain   Pain Onset More than a month ago   Pain Frequency Intermittent                         [facility] Adult PT Treatment/Exercise - 10/15/15 0001    Exercises   Exercises Shoulder;Elbow;Wrist   Elbow Exercises   Elbow Flexion Strengthening;Right;Other reps (comment);Seated   Bar Weights/Barbell (Elbow Flexion) --  7#   Elbow Extension Strengthening;Both  x30 reps Pink XTS   Forearm Supination Strengthening;Right;Other reps (comment);Seated;Bar weights/barbell  7# x30 reps with holds at end range   Forearm Pronation Strengthening;Right;Other reps (comment);Seated;Bar weights/barbell  7# x30 reps   Wrist Flexion Strengthening;Right;Seated;Bar weights/barbell  x30 reps   Bar Weights/Barbell (Wrist Flexion) Other (comment)  7#   Other elbow exercises RUE ranger flexion/circles x30 reps   Other elbow exercises R red web grip x2 min  Shoulder Exercises: Standing   Row Strengthening;Both;20 reps;10 reps  XTS pink 3x10   Other Standing Exercises 6# and 10# ball diagonal reaches 3x10 each way   Other Standing Exercises Standing wall pushups 3 x10   Shoulder Exercises: ROM/Strengthening   UBE (Upper Arm Bike) 90 RPMs x10  min forward/Retro    Wrist Exercises   Other wrist exercises Velcro board rolling rolling small and medium roller   Manual Therapy   Passive ROM --                     PT Long Term Goals - 10/15/15 0845    PT LONG TERM GOAL #1   Title Ind with HEP.   Time 6   Period Weeks   Status Achieved   PT LONG TERM GOAL #2   Title Full right forearm  supination.   Time 6   Period Weeks   Status On-going   PT LONG TERM GOAL #3   Title Full right elbow extension.   Time 6   Period Weeks   Status Achieved   PT LONG TERM GOAL #4   Title Perform ADL's with pain not > 2-3/10.   Time 6   Period Weeks   Status Achieved   PT LONG TERM GOAL #5   Title Reduce right hand edema by 2 cms.   Time 6   Period Weeks   Status Achieved               Plan - 10/15/15 0908    Clinical Impression Statement Pt has met all goals at this time. He has c/o numbness in Thumb and first digit that still affects him when trying to perform fine motor skills such as placing a nut on a bolt. He also has concerns also about how much he is aloud to Lift with RT arm. He will f/u with MD on 10-26-15.   Pt will benefit from skilled therapeutic intervention in order to improve on the following deficits Pain;Decreased activity tolerance;Increased edema;Decreased range of motion   Rehab Potential Excellent   PT Frequency 2x / week   PT Duration 8 weeks   PT Treatment/Interventions ADLs/Self Care Home Management;Cryotherapy;Electrical Stimulation;Moist Heat;Ultrasound;Therapeutic activities;Therapeutic exercise;Manual techniques;Passive range of motion   PT Next Visit Plan Pt will F/U with MD on 03-24-16. We will await MD orders to continue or DC patient        Problem List Patient Active Problem List   Diagnosis Date Noted  . Traumatic rupture of right distal biceps tendon   . PONV (postoperative nausea and vomiting)   . Adenomatous colon polyp 03/17/2014  . Esophageal dysphagia 05/06/2011  . GERD (gastroesophageal reflux disease) 05/06/2011    Tomeeka Plaugher,CHRIS, PTA 10/15/2015, 9:39 AM  [facility] [place], [place], [address] Phone: [phone]   Fax:  [phone]  Name: Ryan Jennings MRN: [id number] Date of Birth: 1961-03-01  PHYSICAL THERAPY DISCHARGE SUMMARY  Visits from Start of Care:  24.  Current functional level related to goals / functional outcomes: See above.   Remaining deficits: Goal #2 unmet.   Education / Equipment: HEP. Plan: Patient agrees to discharge.  Patient goals were partially met. Patient is being discharged due to being pleased with the current functional level.  ?????         Mali Applegate MPT

## 2016-05-13 ENCOUNTER — Other Ambulatory Visit: Payer: Self-pay | Admitting: Nurse Practitioner

## 2016-06-28 ENCOUNTER — Other Ambulatory Visit: Payer: Self-pay | Admitting: Physician Assistant

## 2016-07-05 ENCOUNTER — Ambulatory Visit (INDEPENDENT_AMBULATORY_CARE_PROVIDER_SITE_OTHER): Payer: Commercial Managed Care - HMO

## 2016-07-05 DIAGNOSIS — Z23 Encounter for immunization: Secondary | ICD-10-CM

## 2016-07-15 IMAGING — CT CT HEART SCORING
1 of 3 series · 10 of 20 positions shown, 13 images · non-contrast
Comparison: 06/21/2011

CLINICAL DATA: Risk stratification

EXAM:
Coronary Calcium Score
TECHNIQUE: The patient was scanned on a Siemens Sensation 16 slice scanner.
Axial non-contrast 3 mm slices were carried out through the heart.
The data set was analyzed on a dedicated work station and scored
using the Agatson method.

[Series 6: st thins for reformat · axial · 0.74mm/px · z∈[-247,-145]mm · 10 of 126 slices shown, 13 images]
[im 12/126  vessel]
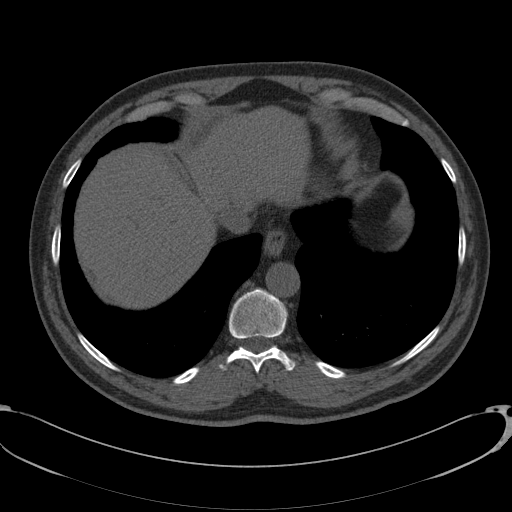
[im 12/126  lung]
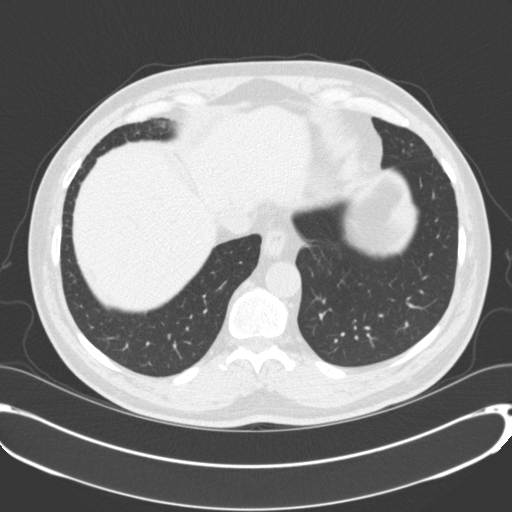
[im 23/126  vessel]
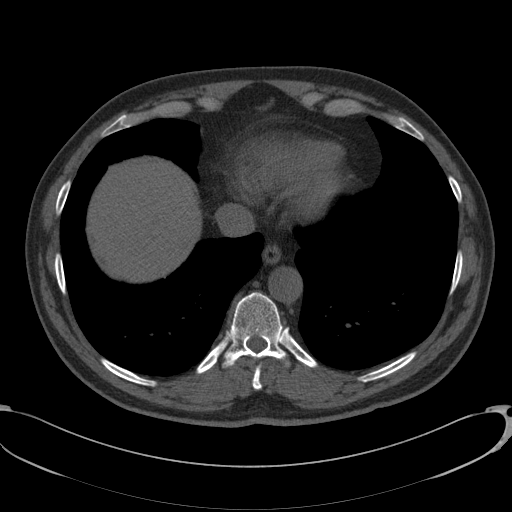
[im 35/126  vessel]
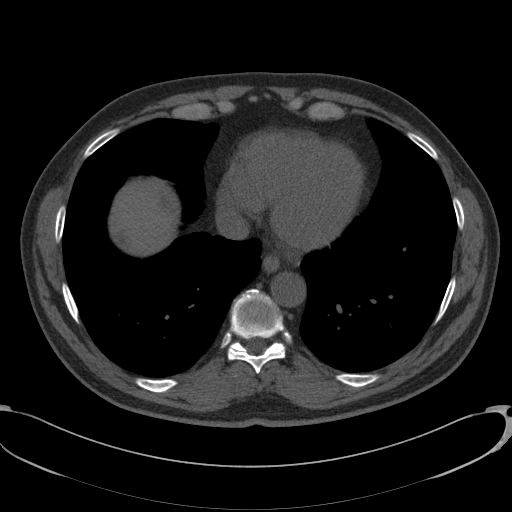
[im 46/126  vessel]
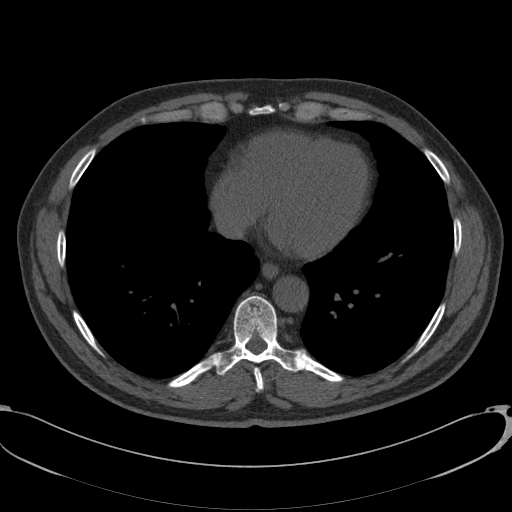
[im 57/126  vessel]
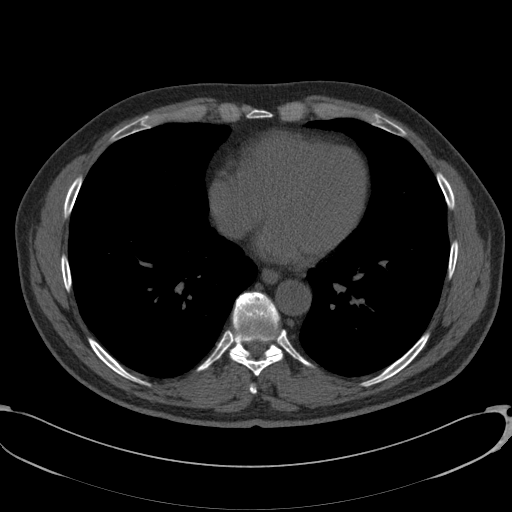
[im 57/126  lung]
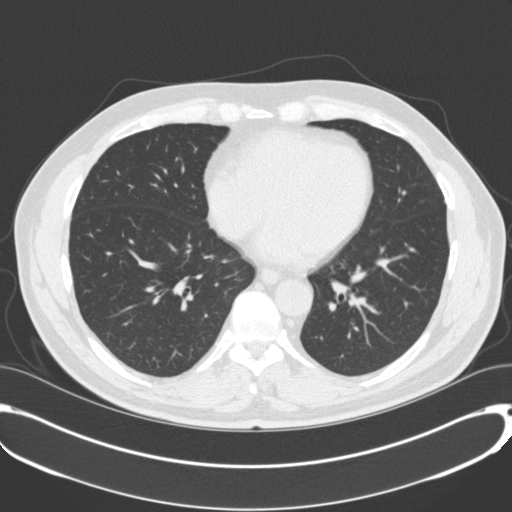
[im 69/126  vessel]
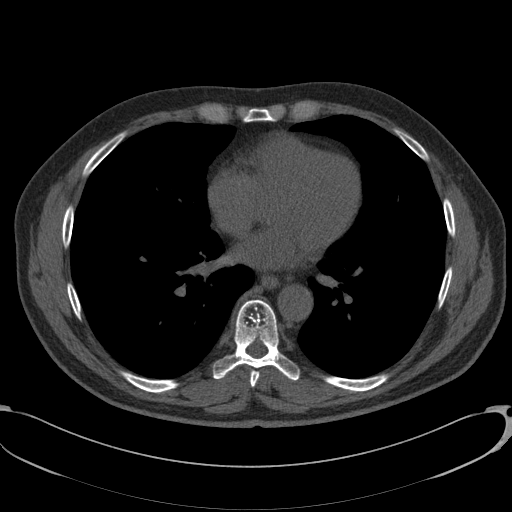
[im 80/126  vessel]
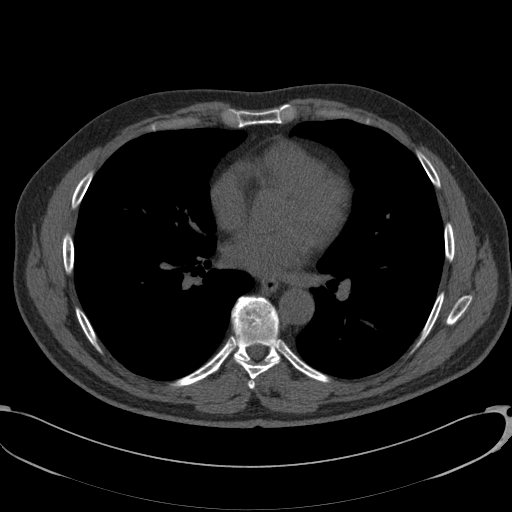
[im 91/126  vessel]
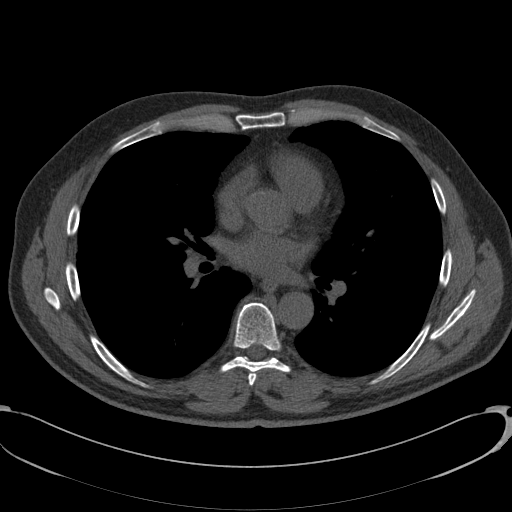
[im 103/126  vessel]
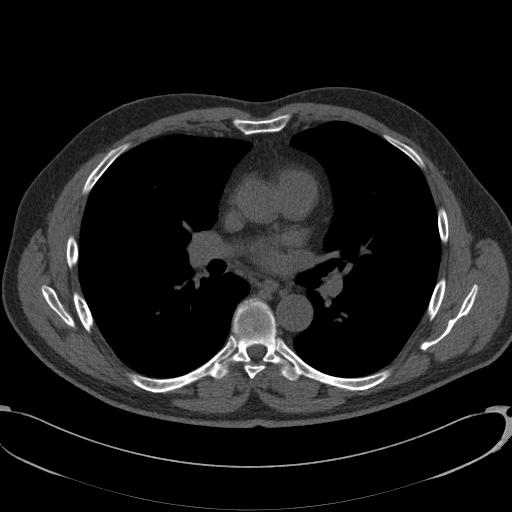
[im 103/126  lung]
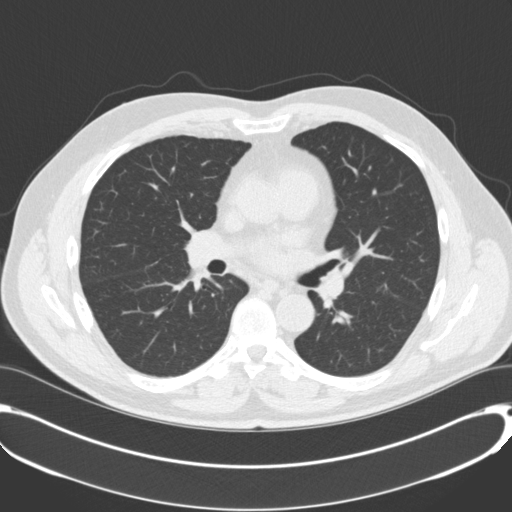
[im 114/126  vessel]
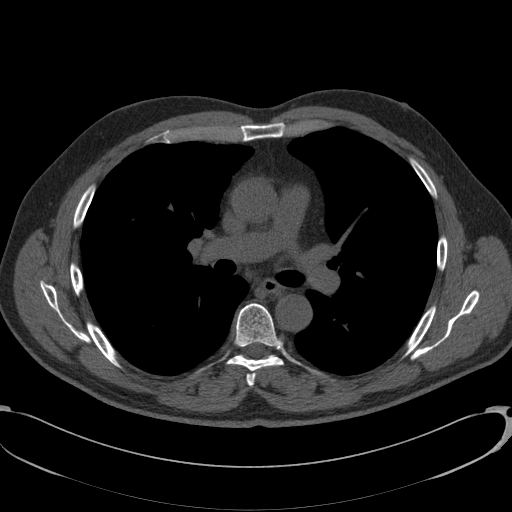

[10 of 20 positions shown; findings below may reference images not displayed]

FINDINGS: Non-cardiac: See separate report from [REDACTED].

Ascending Aorta:

Pericardium: Normal

Coronary arteries:  Originating in normal position.
IMPRESSION: Coronary calcium score of 1. This was 28 percentile for age and sex
matched control.

Mayumi Trigo

EXAM:
OVER-READ INTERPRETATION  CT CHEST

The following report is an over-read performed by radiologist Dr.
over-read does not include interpretation of cardiac or coronary
anatomy or pathology. The coronary calcium score interpretation by
the cardiologist is attached.
FINDINGS: Visualized lungs and tracheobronchial tree unremarkable. No
visualized adenopathy. Dome of the liver unremarkable. Mild thoracic
spondylosis.
IMPRESSION: 1. Mild thoracic spondylosis.

## 2016-12-19 DIAGNOSIS — I861 Scrotal varices: Secondary | ICD-10-CM | POA: Diagnosis not present

## 2016-12-19 DIAGNOSIS — N402 Nodular prostate without lower urinary tract symptoms: Secondary | ICD-10-CM | POA: Diagnosis not present

## 2017-01-20 DIAGNOSIS — H2513 Age-related nuclear cataract, bilateral: Secondary | ICD-10-CM | POA: Diagnosis not present

## 2017-01-20 DIAGNOSIS — H40013 Open angle with borderline findings, low risk, bilateral: Secondary | ICD-10-CM | POA: Diagnosis not present

## 2017-01-20 DIAGNOSIS — H04123 Dry eye syndrome of bilateral lacrimal glands: Secondary | ICD-10-CM | POA: Diagnosis not present

## 2017-02-10 ENCOUNTER — Other Ambulatory Visit: Payer: Self-pay | Admitting: Gastroenterology

## 2017-02-15 DIAGNOSIS — Z0000 Encounter for general adult medical examination without abnormal findings: Secondary | ICD-10-CM | POA: Diagnosis not present

## 2017-02-22 DIAGNOSIS — R808 Other proteinuria: Secondary | ICD-10-CM | POA: Diagnosis not present

## 2017-02-22 DIAGNOSIS — Z1212 Encounter for screening for malignant neoplasm of rectum: Secondary | ICD-10-CM | POA: Diagnosis not present

## 2017-02-22 DIAGNOSIS — E7800 Pure hypercholesterolemia, unspecified: Secondary | ICD-10-CM | POA: Diagnosis not present

## 2017-02-22 DIAGNOSIS — Z0000 Encounter for general adult medical examination without abnormal findings: Secondary | ICD-10-CM | POA: Diagnosis not present

## 2017-02-22 DIAGNOSIS — Z1389 Encounter for screening for other disorder: Secondary | ICD-10-CM | POA: Diagnosis not present

## 2017-02-22 DIAGNOSIS — N400 Enlarged prostate without lower urinary tract symptoms: Secondary | ICD-10-CM | POA: Diagnosis not present

## 2017-11-30 DIAGNOSIS — S0300XA Dislocation of jaw, unspecified side, initial encounter: Secondary | ICD-10-CM | POA: Diagnosis not present

## 2017-12-14 DIAGNOSIS — M26602 Left temporomandibular joint disorder, unspecified: Secondary | ICD-10-CM | POA: Diagnosis not present

## 2017-12-14 DIAGNOSIS — M6281 Muscle weakness (generalized): Secondary | ICD-10-CM | POA: Diagnosis not present

## 2017-12-14 DIAGNOSIS — M62838 Other muscle spasm: Secondary | ICD-10-CM | POA: Diagnosis not present

## 2017-12-25 DIAGNOSIS — M26601 Right temporomandibular joint disorder, unspecified: Secondary | ICD-10-CM | POA: Diagnosis not present

## 2017-12-25 DIAGNOSIS — M62838 Other muscle spasm: Secondary | ICD-10-CM | POA: Diagnosis not present

## 2017-12-25 DIAGNOSIS — M6281 Muscle weakness (generalized): Secondary | ICD-10-CM | POA: Diagnosis not present

## 2017-12-27 DIAGNOSIS — I861 Scrotal varices: Secondary | ICD-10-CM | POA: Diagnosis not present

## 2017-12-27 DIAGNOSIS — N402 Nodular prostate without lower urinary tract symptoms: Secondary | ICD-10-CM | POA: Diagnosis not present

## 2017-12-27 DIAGNOSIS — R350 Frequency of micturition: Secondary | ICD-10-CM | POA: Diagnosis not present

## 2018-01-03 DIAGNOSIS — M62838 Other muscle spasm: Secondary | ICD-10-CM | POA: Diagnosis not present

## 2018-01-03 DIAGNOSIS — M6281 Muscle weakness (generalized): Secondary | ICD-10-CM | POA: Diagnosis not present

## 2018-01-03 DIAGNOSIS — M26601 Right temporomandibular joint disorder, unspecified: Secondary | ICD-10-CM | POA: Diagnosis not present

## 2018-01-08 DIAGNOSIS — M62838 Other muscle spasm: Secondary | ICD-10-CM | POA: Diagnosis not present

## 2018-01-08 DIAGNOSIS — M26621 Arthralgia of right temporomandibular joint: Secondary | ICD-10-CM | POA: Diagnosis not present

## 2018-01-08 DIAGNOSIS — M6281 Muscle weakness (generalized): Secondary | ICD-10-CM | POA: Diagnosis not present

## 2018-01-15 DIAGNOSIS — M26601 Right temporomandibular joint disorder, unspecified: Secondary | ICD-10-CM | POA: Diagnosis not present

## 2018-01-15 DIAGNOSIS — M6281 Muscle weakness (generalized): Secondary | ICD-10-CM | POA: Diagnosis not present

## 2018-01-15 DIAGNOSIS — M62838 Other muscle spasm: Secondary | ICD-10-CM | POA: Diagnosis not present

## 2018-01-23 DIAGNOSIS — M6281 Muscle weakness (generalized): Secondary | ICD-10-CM | POA: Diagnosis not present

## 2018-01-23 DIAGNOSIS — M26601 Right temporomandibular joint disorder, unspecified: Secondary | ICD-10-CM | POA: Diagnosis not present

## 2018-01-23 DIAGNOSIS — M62838 Other muscle spasm: Secondary | ICD-10-CM | POA: Diagnosis not present

## 2018-02-06 DIAGNOSIS — H2513 Age-related nuclear cataract, bilateral: Secondary | ICD-10-CM | POA: Diagnosis not present

## 2018-02-06 DIAGNOSIS — H40013 Open angle with borderline findings, low risk, bilateral: Secondary | ICD-10-CM | POA: Diagnosis not present

## 2018-02-06 DIAGNOSIS — H04123 Dry eye syndrome of bilateral lacrimal glands: Secondary | ICD-10-CM | POA: Diagnosis not present

## 2018-02-07 DIAGNOSIS — M6281 Muscle weakness (generalized): Secondary | ICD-10-CM | POA: Diagnosis not present

## 2018-02-07 DIAGNOSIS — M62838 Other muscle spasm: Secondary | ICD-10-CM | POA: Diagnosis not present

## 2018-02-07 DIAGNOSIS — M26601 Right temporomandibular joint disorder, unspecified: Secondary | ICD-10-CM | POA: Diagnosis not present

## 2018-02-16 DIAGNOSIS — Z0000 Encounter for general adult medical examination without abnormal findings: Secondary | ICD-10-CM | POA: Diagnosis not present

## 2018-02-16 DIAGNOSIS — R82998 Other abnormal findings in urine: Secondary | ICD-10-CM | POA: Diagnosis not present

## 2018-02-23 DIAGNOSIS — M2669 Other specified disorders of temporomandibular joint: Secondary | ICD-10-CM | POA: Diagnosis not present

## 2018-02-23 DIAGNOSIS — Z0000 Encounter for general adult medical examination without abnormal findings: Secondary | ICD-10-CM | POA: Diagnosis not present

## 2018-02-23 DIAGNOSIS — R808 Other proteinuria: Secondary | ICD-10-CM | POA: Diagnosis not present

## 2018-02-23 DIAGNOSIS — Z1389 Encounter for screening for other disorder: Secondary | ICD-10-CM | POA: Diagnosis not present

## 2018-02-23 DIAGNOSIS — I861 Scrotal varices: Secondary | ICD-10-CM | POA: Diagnosis not present

## 2018-02-27 DIAGNOSIS — Z1212 Encounter for screening for malignant neoplasm of rectum: Secondary | ICD-10-CM | POA: Diagnosis not present

## 2018-02-27 DIAGNOSIS — M6281 Muscle weakness (generalized): Secondary | ICD-10-CM | POA: Diagnosis not present

## 2018-02-27 DIAGNOSIS — M26601 Right temporomandibular joint disorder, unspecified: Secondary | ICD-10-CM | POA: Diagnosis not present

## 2018-02-27 DIAGNOSIS — M62838 Other muscle spasm: Secondary | ICD-10-CM | POA: Diagnosis not present

## 2018-04-03 ENCOUNTER — Other Ambulatory Visit: Payer: Self-pay | Admitting: Physician Assistant

## 2018-04-03 DIAGNOSIS — D229 Melanocytic nevi, unspecified: Secondary | ICD-10-CM | POA: Diagnosis not present

## 2018-04-03 DIAGNOSIS — L570 Actinic keratosis: Secondary | ICD-10-CM | POA: Diagnosis not present

## 2018-04-03 DIAGNOSIS — D485 Neoplasm of uncertain behavior of skin: Secondary | ICD-10-CM | POA: Diagnosis not present

## 2018-04-03 DIAGNOSIS — L281 Prurigo nodularis: Secondary | ICD-10-CM | POA: Diagnosis not present

## 2019-03-04 ENCOUNTER — Encounter: Payer: Self-pay | Admitting: Internal Medicine

## 2020-03-05 ENCOUNTER — Encounter: Payer: Self-pay | Admitting: Internal Medicine

## 2020-04-30 NOTE — Progress Notes (Signed)
Referring Provider: Haywood Pao, MD Primary Care Physician:  Haywood Pao, MD Primary Gastroenterologist:  Dr. [name]  Chief Complaint  Patient presents with  . Colonoscopy    due for tcs    HPI:   Ryan Jennings is a 59 y.o. male presenting today to schedule surveillance colonoscopy. History of adenomatous colon polyps, GERD, reflux esophagitis, and dyspahgia with schatzki ring s/p dilation in 2010 and 2012. Last colonoscopy in July 2015 with multiple rectal and colonic polyps, abnormal cecum (query Goody powder effect) s/p biopsy.  Tubular adenomas on pathology with benign colonic mucosal biopsy with inflammation likely secondary to NSAIDs.  Recommend repeat colonoscopy in 5 years.  Today: No abdominal pain. BMs daily.  No constipation or diarrhea.  No blood in the stool or black stool. No unintentional weight loss. No N/V. Occasional GERD symptoms if he misses a Dexilant. Otherwise, GERD is well controlled. No dysphagia.    Past Medical History:  Diagnosis Date  . Adenomatous colon polyp 2010  . Esophageal ring    2010, 2012  . GERD (gastroesophageal reflux disease)   . Hyperlipidemia   . Nephrolithiasis   . PONV (postoperative nausea and vomiting)   . PONV (postoperative nausea and vomiting)   . Traumatic rupture of right distal biceps tendon     Past Surgical History:  Procedure Laterality Date  . APPENDECTOMY  8/11  . BICEPS TENDON REPAIR     bilateral  . COLONOSCOPY  04/2009   sigmoid adenomatous polyp, next colonoscopy 04/2014  . COLONOSCOPY N/A 03/24/2014   Procedure: COLONOSCOPY;  Surgeon: [name], multiple rectal and colonic polyps, abnormal cecum (query Goody powder effect) s/p biopsy.  Tubular adenomas on pathology with benign colonic mucosal biopsy with inflammation likely secondary to NSAIDs.  Recommend repeat colonoscopy in 5 years.  Marland Kitchen DISTAL BICEPS TENDON REPAIR Right 06/03/2015   Procedure: RIGHT DISTAL BICEPS TENDON REPAIR;  Surgeon:  Elsie Saas, MD;  Location: [place];  Service: Orthopedics;  Laterality: Right;  . ESOPHAGOGASTRODUODENOSCOPY  04/2009   distal erosive reflux esophagitis, schatzki ring s/p 51F, hh, couple of antral erosions  . ESOPHAGOGASTRODUODENOSCOPY  05/2011   Dr. [name]: erosive reflux esophagitis, superimposed on Schatzki's ring and early stricture s/p 22 F, small hiatal hernia  . HERNIA REPAIR     x 3  . ROTATOR CUFF REPAIR     right    Current Outpatient Medications  Medication Sig Dispense Refill  . atorvastatin (LIPITOR) 20 MG tablet Take 20 mg by mouth daily.    Marland Kitchen DEXILANT 60 MG capsule TAKE ONE (1) CAPSULE EACH DAY 90 capsule 3  . Multiple Vitamin (MULTIVITAMIN) tablet Take 1 tablet by mouth daily.     No current facility-administered medications for this visit.    Allergies as of 05/01/2020 - Review Complete 05/01/2020  Allergen Reaction Noted  . Penicillins Rash     Family History  Problem Relation Age of Onset  . Lymphoma Mother 106       deceased  . Colon polyps Father        before age of 48  . Stroke Paternal Grandfather 28  . Pancreatic cancer Maternal Grandfather   . Colon cancer Neg Hx     Social History   Socioeconomic History  . Marital status: Married    Spouse name: Not on file  . Number of children: 3  . Years of education: Not on file  . Highest education level: Not on file  Occupational  History    Employer: [employer]  Tobacco Use  . Smoking status: Never Smoker  . Smokeless tobacco: Never Used  Substance and Sexual Activity  . Alcohol use: No  . Drug use: No  . Sexual activity: Not on file  Other Topics Concern  . Not on file  Social History Narrative   Lives at home with wife and daughter.     Social Determinants of Health   Financial Resource Strain:   . Difficulty of Paying Living Expenses: Not on file  Food Insecurity:   . Worried About Charity fundraiser in the Last Year: Not on file  . Ran Out of Food in the  Last Year: Not on file  Transportation Needs:   . Lack of Transportation (Medical): Not on file  . Lack of Transportation (Non-Medical): Not on file  Physical Activity:   . Days of Exercise per Week: Not on file  . Minutes of Exercise per Session: Not on file  Stress:   . Feeling of Stress : Not on file  Social Connections:   . Frequency of Communication with Friends and Family: Not on file  . Frequency of Social Gatherings with Friends and Family: Not on file  . Attends Religious Services: Not on file  . Active Member of Clubs or Organizations: Not on file  . Attends Archivist Meetings: Not on file  . Marital Status: Not on file  Intimate Partner Violence:   . Fear of Current or Ex-Partner: Not on file  . Emotionally Abused: Not on file  . Physically Abused: Not on file  . Sexually Abused: Not on file    Review of Systems: Gen: Denies any fever, chills, cold or flulike symptoms, lightheadedness, dizziness, presyncope, syncope. CV: Denies chest pain or palpitations. Resp: Denies shortness of breath or cough. GI: See HPI  GU : Denies urinary burning, urinary frequency, urinary hesitancy Derm: Denies rash Psych: Denies depression or anxiety Heme: See HPI  Physical Exam: BP (!) 160/89   Pulse 63   Temp (!) 97.3 F (36.3 C) (Temporal)   Ht '5\' 9"'  (1.753 m)   Wt 164 lb 9.6 oz (74.7 kg)   BMI 24.31 kg/m  General:   Alert and oriented. Pleasant and cooperative. Well-nourished and well-developed.  Head:  Normocephalic and atraumatic. Eyes:  Without icterus, sclera clear and conjunctiva pink.  Ears:  Normal auditory acuity. Lungs:  Clear to auscultation bilaterally. No wheezes, rales, or rhonchi. No distress.  Heart:  S1, S2 present without murmurs appreciated.  Abdomen:  +BS, soft, non-tender and non-distended. No HSM noted. No guarding or rebound. No masses appreciated.  Rectal:  Deferred  Msk:  Symmetrical without gross deformities. Normal posture. Extremities:   Without edema. Neurologic:  Alert and  oriented x4;  grossly normal neurologically. Skin:  Intact without significant lesions or rashes. Psych: Normal mood and affect.  Labs: 02/27/2020 CBC: WBC spine 8, hemoglobin 14.9, hematocrit 45.7, MCV 101.1, MCH 33.3, MCH C 32.6, platelets 260 CMP: Glucose 102, creatinine 0.9, sodium 142, potassium 4.7, chloride 106, calcium 9.1, total protein 620, BUN 4.1, AST 14, ALT 14, alk phos 83, total bilirubin 0.9

## 2020-05-01 ENCOUNTER — Other Ambulatory Visit: Payer: Self-pay

## 2020-05-01 ENCOUNTER — Encounter: Payer: Self-pay | Admitting: Gastroenterology

## 2020-05-01 ENCOUNTER — Ambulatory Visit: Payer: 59 | Admitting: Gastroenterology

## 2020-05-01 DIAGNOSIS — Z86010 Personal history of colonic polyps: Secondary | ICD-10-CM

## 2020-05-01 DIAGNOSIS — Z860101 Personal history of adenomatous and serrated colon polyps: Secondary | ICD-10-CM | POA: Insufficient documentation

## 2020-05-01 NOTE — Patient Instructions (Signed)
We will get you scheduled for colonoscopy in the near future with Dr. [name].  We will follow up with you in the office as Dr. [name] recommends.  Do not hesitate to call if you have questions or concerns.  Aliene Altes, PA-C [facility]

## 2020-05-01 NOTE — Assessment & Plan Note (Addendum)
59 year old male presenting today to schedule surveillance colonoscopy.  History of adenomatous colon polyps.  Last colonoscopy July 2015 with multiple rectal and colon polyps, abnormal cecum s/p biopsy.  Tubular adenomas on pathology with benign colon mucosa biopsy with inflammation likely secondary to NSAIDs.  Recommended repeat colonoscopy in 5 years.  He has no significant upper or lower GI symptoms.  No alarm symptoms.  No family history of colon cancer.  Father with history of colon polyps.  Plan: Proceed with colonoscopy with Dr. [name] in the near future. The risks, benefits, and alternatives have been discussed with the patient in detail. The patient states understanding and desires to proceed.  ASA II Follow-up as Dr. [name] recommends.

## 2020-05-04 ENCOUNTER — Telehealth: Payer: Self-pay

## 2020-05-04 NOTE — Telephone Encounter (Signed)
Called pt, TCS scheduled for 06/24/20 at 8:15am. COVID test 06/23/20 at 9:00am. Orders entered. Appt letter mailed with procedure instructions.  PA for TCS submitted via [facility] website. Case approved. PA# [id number], valid 06/24/20-09/22/20.

## 2020-05-04 NOTE — Telephone Encounter (Signed)
Tried to call pt to schedule TCS w/Dr. [name] (conscious sedation), no answer, LMOVM for return call.

## 2020-05-04 NOTE — Telephone Encounter (Signed)
Returning a call.  Can you call him at [phone]?

## 2020-06-02 ENCOUNTER — Telehealth: Payer: Self-pay | Admitting: Internal Medicine

## 2020-06-02 NOTE — Telephone Encounter (Signed)
Pt wants to reschedule his procedure with Dr [name] on 06/24/2020. [phone]

## 2020-06-02 NOTE — Telephone Encounter (Signed)
Called pt. His procedure has been rescheduled to 12/1 at 8:30am. Called endo and lmovm. Aware will mail new instructions with new covid test appt   PA for TCS submitted via [facility] website. Case approved. PA# [id number], valid 06/24/20-09/22/20.

## 2020-06-23 ENCOUNTER — Other Ambulatory Visit (HOSPITAL_COMMUNITY): Payer: 59

## 2020-08-03 ENCOUNTER — Other Ambulatory Visit (HOSPITAL_COMMUNITY): Payer: Self-pay | Admitting: *Deleted

## 2020-08-04 ENCOUNTER — Other Ambulatory Visit (HOSPITAL_COMMUNITY)
Admission: RE | Admit: 2020-08-04 | Discharge: 2020-08-04 | Disposition: A | Discharge status: Home / Self Care | Payer: 59 | Source: Ambulatory Visit | Attending: Internal Medicine | Admitting: Internal Medicine

## 2020-08-04 ENCOUNTER — Other Ambulatory Visit: Payer: Self-pay

## 2020-08-04 DIAGNOSIS — Z01812 Encounter for preprocedural laboratory examination: Secondary | ICD-10-CM | POA: Insufficient documentation

## 2020-08-04 DIAGNOSIS — Z20822 Contact with and (suspected) exposure to covid-19: Secondary | ICD-10-CM | POA: Insufficient documentation

## 2020-08-04 LAB — SARS CORONAVIRUS 2 (TAT 6-24 HRS): SARS Coronavirus 2: NEGATIVE

## 2020-08-05 ENCOUNTER — Other Ambulatory Visit: Payer: Self-pay

## 2020-08-05 ENCOUNTER — Encounter (HOSPITAL_COMMUNITY): Payer: Self-pay | Admitting: Internal Medicine

## 2020-08-05 ENCOUNTER — Ambulatory Visit (HOSPITAL_COMMUNITY)
Admission: RE | Admit: 2020-08-05 | Discharge: 2020-08-05 | Disposition: A | Discharge status: Home / Self Care | Payer: 59 | Attending: Internal Medicine | Admitting: Internal Medicine

## 2020-08-05 ENCOUNTER — Encounter (HOSPITAL_COMMUNITY)
Admission: RE | Disposition: A | Discharge status: Home / Self Care | Payer: Self-pay | Source: Home / Self Care | Attending: Internal Medicine

## 2020-08-05 DIAGNOSIS — Z880 Allergy status to penicillin: Secondary | ICD-10-CM | POA: Diagnosis not present

## 2020-08-05 DIAGNOSIS — Z1211 Encounter for screening for malignant neoplasm of colon: Secondary | ICD-10-CM | POA: Diagnosis not present

## 2020-08-05 DIAGNOSIS — Z86010 Personal history of colonic polyps: Secondary | ICD-10-CM | POA: Diagnosis present

## 2020-08-05 DIAGNOSIS — Z79899 Other long term (current) drug therapy: Secondary | ICD-10-CM | POA: Insufficient documentation

## 2020-08-05 DIAGNOSIS — D123 Benign neoplasm of transverse colon: Secondary | ICD-10-CM | POA: Insufficient documentation

## 2020-08-05 DIAGNOSIS — K635 Polyp of colon: Secondary | ICD-10-CM

## 2020-08-05 HISTORY — PX: COLONOSCOPY: SHX5424

## 2020-08-05 HISTORY — PX: POLYPECTOMY: SHX5525

## 2020-08-05 SURGERY — COLONOSCOPY
Anesthesia: Moderate Sedation

## 2020-08-05 MED ORDER — SODIUM CHLORIDE 0.9 % IV SOLN: INTRAVENOUS | Status: DC

## 2020-08-05 MED ORDER — MIDAZOLAM HCL 5 MG/5ML IJ SOLN
INTRAMUSCULAR | Status: AC
  Filled 2020-08-05: qty 10

## 2020-08-05 MED ORDER — STERILE WATER FOR IRRIGATION IR SOLN: Status: DC | PRN

## 2020-08-05 MED ORDER — MEPERIDINE HCL 100 MG/ML IJ SOLN
INTRAMUSCULAR | Status: DC | PRN
  Administered 2020-08-05: 15 mg
  Administered 2020-08-05: 25 mg

## 2020-08-05 MED ORDER — MIDAZOLAM HCL 5 MG/5ML IJ SOLN
INTRAMUSCULAR | Status: DC | PRN
  Administered 2020-08-05: 2 mg via INTRAVENOUS
  Administered 2020-08-05: 1 mg via INTRAVENOUS
  Administered 2020-08-05: 2 mg via INTRAVENOUS

## 2020-08-05 MED ORDER — MEPERIDINE HCL 50 MG/ML IJ SOLN
INTRAMUSCULAR | Status: AC
  Filled 2020-08-05: qty 1

## 2020-08-05 MED ORDER — ONDANSETRON HCL 4 MG/2ML IJ SOLN
INTRAMUSCULAR | Status: AC
  Filled 2020-08-05: qty 2

## 2020-08-05 MED ORDER — ONDANSETRON HCL 4 MG/2ML IJ SOLN
INTRAMUSCULAR | Status: DC | PRN
  Administered 2020-08-05: 4 mg via INTRAVENOUS

## 2020-08-05 NOTE — Op Note (Signed)
[facility] Patient Name: Ryan Jennings Procedure Date: 08/05/2020 7:57 AM MRN: [id number] Date of Birth: 1961/08/02 Attending MD: Norvel Richards , MD CSN: [id number] Age: 59 Admit Type: Outpatient Procedure:                Colonoscopy Indications:              High risk colon cancer surveillance: Personal                            history of colonic polyps Providers:                Norvel Richards, MD, Janeece Riggers, RN, Raphael Gibney, Technician Referring MD:              Medicines:                Midazolam 5 mg IV, Meperidine 40 mg IV Complications:            No immediate complications. Estimated Blood Loss:     Estimated blood loss was minimal. Procedure:                Pre-Anesthesia Assessment:                           - Prior to the procedure, a History and Physical                            was performed, and patient medications and                            allergies were reviewed. The patient's tolerance of                            previous anesthesia was also reviewed. The risks                            and benefits of the procedure and the sedation                            options and risks were discussed with the patient.                            All questions were answered, and informed consent                            was obtained. Prior Anticoagulants: The patient has                            taken no previous anticoagulant or antiplatelet                            agents. ASA Grade Assessment: II - A patient with  mild systemic disease. After reviewing the risks                            and benefits, the patient was deemed in                            satisfactory condition to undergo the procedure.                           After obtaining informed consent, the colonoscope                            was passed under direct vision. Throughout the                             procedure, the patient's blood pressure, pulse, and                            oxygen saturations were monitored continuously. The                            CF-[id number] ([id number]) scope was introduced through                            the anus and advanced to the the cecum, identified                            by appendiceal orifice and ileocecal valve. The                            colonoscopy was performed without difficulty. The                            patient tolerated the procedure well. The quality                            of the bowel preparation was adequate. Scope In: 8:16:04 AM Scope Out: 8:30:10 AM Scope Withdrawal Time: 0 hours 9 minutes 14 seconds  Total Procedure Duration: 0 hours 14 minutes 6 seconds  Findings:      Two semi-pedunculated polyps were found in the splenic flexure and       hepatic flexure. The polyps were 5 to 8 mm in size. These polyps were       removed with a cold snare. Resection and retrieval were complete.       Estimated blood loss was minimal.      The exam was otherwise without abnormality on direct and retroflexion       views. Impression:               - Two 5 to 8 mm polyps at the splenic flexure and                            at the hepatic flexure, removed with a cold snare.  Resected and retrieved.                           - The examination was otherwise normal on direct                            and retroflexion views. Moderate Sedation:      Moderate (conscious) sedation was personally administered by an endo       nurse. The following parameters were monitored: oxygen saturation, heart       rate, blood pressure, respiratory rate, EKG, adequacy of pulmonary       ventilation, and response to care. Total physician intraservice time was       21 minutes. Recommendation:           - Patient has a contact number available for                            emergencies. The signs and symptoms of potential                             delayed complications were discussed with the                            patient. Return to normal activities tomorrow.                            Written discharge instructions were provided to the                            patient.                           - Advance diet as tolerated.                           - Continue present medications.                           - Repeat colonoscopy date to be determined after                            pending pathology results are reviewed for                            surveillance based on pathology results.                           - Return to GI office (date not yet determined). Procedure Code(s):        --- Professional ---                           [phone], Colonoscopy, flexible; with removal of                            tumor(s), polyp(s), or other lesion(s) by snare  technique Diagnosis Code(s):        --- Professional ---                           Z86.010, Personal history of colonic polyps                           K63.5, Polyp of colon CPT copyright 2019 American Medical Association. All rights reserved. The codes documented in this report are preliminary and upon coder review may  be revised to meet current compliance requirements. Cristopher Estimable. Raygan Skarda, MD Norvel Richards, MD 08/05/2020 8:36:16 AM This report has been signed electronically. Number of Addenda: 0

## 2020-08-05 NOTE — H&P (Signed)
@LOGO @   Primary Care Physician:  Tisovec, Fransico Him, MD Primary Gastroenterologist:  Dr. [name]  Pre-Procedure History & Physical: HPI:  Ryan Jennings is a 59 y.o. male here for surveillance colonoscopy.  History of multiple colonic adenomas removed 2015.  Past Medical History:  Diagnosis Date  . Adenomatous colon polyp 2010  . Esophageal ring    2010, 2012  . GERD (gastroesophageal reflux disease)   . Hyperlipidemia   . Nephrolithiasis   . PONV (postoperative nausea and vomiting)   . PONV (postoperative nausea and vomiting)   . Traumatic rupture of right distal biceps tendon     Past Surgical History:  Procedure Laterality Date  . APPENDECTOMY  8/11  . BICEPS TENDON REPAIR     bilateral  . COLONOSCOPY  04/2009   sigmoid adenomatous polyp, next colonoscopy 04/2014  . COLONOSCOPY N/A 03/24/2014   Procedure: COLONOSCOPY;  Surgeon: [name], multiple rectal and colonic polyps, abnormal cecum (query Goody powder effect) s/p biopsy.  Tubular adenomas on pathology with benign colonic mucosal biopsy with inflammation likely secondary to NSAIDs.  Recommend repeat colonoscopy in 5 years.  Marland Kitchen DISTAL BICEPS TENDON REPAIR Right 06/03/2015   Procedure: RIGHT DISTAL BICEPS TENDON REPAIR;  Surgeon: Elsie Saas, MD;  Location: [place];  Service: Orthopedics;  Laterality: Right;  . ESOPHAGOGASTRODUODENOSCOPY  04/2009   distal erosive reflux esophagitis, schatzki ring s/p 47F, hh, couple of antral erosions  . ESOPHAGOGASTRODUODENOSCOPY  05/2011   Dr. [name]: erosive reflux esophagitis, superimposed on Schatzki's ring and early stricture s/p 66 F, small hiatal hernia  . HERNIA REPAIR     x 3  . ROTATOR CUFF REPAIR     right    Prior to Admission medications   Medication Sig Start Date End Date Taking? Authorizing Provider  ascorbic acid (VITAMIN C) 500 MG tablet Take 500 mg by mouth 3 (three) times a week.   Yes [provider]  DEXILANT 60 MG capsule TAKE ONE  (1) CAPSULE EACH DAY Patient taking differently: Take 60 mg by mouth at bedtime.  02/12/17  Yes Annitta Needs, NP    Allergies as of 05/04/2020 - Review Complete 05/01/2020  Allergen Reaction Noted  . Penicillins Rash     Family History  Problem Relation Age of Onset  . Lymphoma Mother 13       deceased  . Colon polyps Father        before age of 35  . Stroke Paternal Grandfather 78  . Pancreatic cancer Maternal Grandfather   . Colon cancer Neg Hx     Social History   Socioeconomic History  . Marital status: Married    Spouse name: Not on file  . Number of children: 3  . Years of education: Not on file  . Highest education level: Not on file  Occupational History    Employer: [employer]  Tobacco Use  . Smoking status: Never Smoker  . Smokeless tobacco: Never Used  Substance and Sexual Activity  . Alcohol use: No  . Drug use: No  . Sexual activity: Not on file  Other Topics Concern  . Not on file  Social History Narrative   Lives at home with wife and daughter.     Social Determinants of Health   Financial Resource Strain:   . Difficulty of Paying Living Expenses: Not on file  Food Insecurity:   . Worried About Charity fundraiser in the Last Year: Not on file  .  Ran Out of Food in the Last Year: Not on file  Transportation Needs:   . Lack of Transportation (Medical): Not on file  . Lack of Transportation (Non-Medical): Not on file  Physical Activity:   . Days of Exercise per Week: Not on file  . Minutes of Exercise per Session: Not on file  Stress:   . Feeling of Stress : Not on file  Social Connections:   . Frequency of Communication with Friends and Family: Not on file  . Frequency of Social Gatherings with Friends and Family: Not on file  . Attends Religious Services: Not on file  . Active Member of Clubs or Organizations: Not on file  . Attends Archivist Meetings: Not on file  . Marital Status: Not on file  Intimate Partner  Violence:   . Fear of Current or Ex-Partner: Not on file  . Emotionally Abused: Not on file  . Physically Abused: Not on file  . Sexually Abused: Not on file    Review of Systems: See HPI, otherwise negative ROS  Physical Exam: BP (!) 145/76   Temp 97.8 F (36.6 C) (Oral)   Resp 14   Ht 5\' 8"  (1.727 m)   Wt 72.6 kg   SpO2 98%   BMI 24.33 kg/m  General:   Alert,  Well-developed, well-nourished, pleasant and cooperative in NAD Neck:  Supple; no masses or thyromegaly. No significant cervical adenopathy. Lungs:  Clear throughout to auscultation.   No wheezes, crackles, or rhonchi. No acute distress. Heart:  Regular rate and rhythm; no murmurs, clicks, rubs,  or gallops. Abdomen: Non-distended, normal bowel sounds.  Soft and nontender without appreciable mass or hepatosplenomegaly.  Pulses:  Normal pulses noted. Extremities:  Without clubbing or edema.  Impression/Plan: 59 year old gentleman here for surveillance colonoscopy.  History of multiple colonic adenomas.  Of offer the patient a surveillance colonoscopy today per plan. The risks, benefits, limitations, alternatives and imponderables have been reviewed with the patient. Questions have been answered. All parties are agreeable.      Notice: This dictation was prepared with Dragon dictation along with smaller phrase technology. Any transcriptional errors that result from this process are unintentional and may not be corrected upon review.

## 2020-08-05 NOTE — Discharge Instructions (Signed)
Colonoscopy Discharge Instructions  Read the instructions outlined below and refer to this sheet in the next few weeks. These discharge instructions provide you with general information on caring for yourself after you leave the hospital. Your doctor may also give you specific instructions. While your treatment has been planned according to the most current medical practices available, unavoidable complications occasionally occur. If you have any problems or questions after discharge, call Dr. [name] at [phone]. ACTIVITY  You may resume your regular activity, but move at a slower pace for the next 24 hours.   Take frequent rest periods for the next 24 hours.   Walking will help get rid of the air and reduce the bloated feeling in your belly (abdomen).   No driving for 24 hours (because of the medicine (anesthesia) used during the test).    Do not sign any important legal documents or operate any machinery for 24 hours (because of the anesthesia used during the test).  NUTRITION  Drink plenty of fluids.   You may resume your normal diet as instructed by your doctor.   Begin with a light meal and progress to your normal diet. Heavy or fried foods are harder to digest and may make you feel sick to your stomach (nauseated).   Avoid alcoholic beverages for 24 hours or as instructed.  MEDICATIONS  You may resume your normal medications unless your doctor tells you otherwise.  WHAT YOU CAN EXPECT TODAY  Some feelings of bloating in the abdomen.   Passage of more gas than usual.   Spotting of blood in your stool or on the toilet paper.  IF YOU HAD POLYPS REMOVED DURING THE COLONOSCOPY:  No aspirin products for 7 days or as instructed.   No alcohol for 7 days or as instructed.   Eat a soft diet for the next 24 hours.  FINDING OUT THE RESULTS OF YOUR TEST Not all test results are available during your visit. If your test results are not back during the visit, make an appointment  with your caregiver to find out the results. Do not assume everything is normal if you have not heard from your caregiver or the medical facility. It is important for you to follow up on all of your test results.  SEEK IMMEDIATE MEDICAL ATTENTION IF:  You have more than a spotting of blood in your stool.   Your belly is swollen (abdominal distention).   You are nauseated or vomiting.   You have a temperature over 101.   You have abdominal pain or discomfort that is severe or gets worse throughout the day.    Colon Polyps  Polyps are tissue growths inside the body. Polyps can grow in many places, including the large intestine (colon). A polyp may be a round bump or a mushroom-shaped growth. You could have one polyp or several. Most colon polyps are noncancerous (benign). However, some colon polyps can become cancerous over time. Finding and removing the polyps early can help prevent this. What are the causes? The exact cause of colon polyps is not known. What increases the risk? You are more likely to develop this condition if you:  Have a family history of colon cancer or colon polyps.  Are older than 67 or older than 45 if you are African American.  Have inflammatory bowel disease, such as ulcerative colitis or Crohn's disease.  Have certain hereditary conditions, such as: ? Familial adenomatous polyposis. ? Lynch syndrome. ? Turcot syndrome. ? Peutz-Jeghers syndrome.  Are  overweight.  Smoke cigarettes.  Do not get enough exercise.  Drink too much alcohol.  Eat a diet that is high in fat and red meat and low in fiber.  Had childhood cancer that was treated with abdominal radiation. What are the signs or symptoms? Most polyps do not cause symptoms. If you have symptoms, they may include:  Blood coming from your rectum when having a bowel movement.  Blood in your stool. The stool may look dark red or black.  Abdominal pain.  A change in bowel habits, such as  constipation or diarrhea. How is this diagnosed? This condition is diagnosed with a colonoscopy. This is a procedure in which a lighted, flexible scope is inserted into the anus and then passed into the colon to examine the area. Polyps are sometimes found when a colonoscopy is done as part of routine cancer screening tests. How is this treated? Treatment for this condition involves removing any polyps that are found. Most polyps can be removed during a colonoscopy. Those polyps will then be tested for cancer. Additional treatment may be needed depending on the results of testing. Follow these instructions at home: Lifestyle  Maintain a healthy weight, or lose weight if recommended by your health care provider.  Exercise every day or as told by your health care provider.  Do not use any products that contain nicotine or tobacco, such as cigarettes and e-cigarettes. If you need help quitting, ask your health care provider.  If you drink alcohol, limit how much you have: ? 0-1 drink a day for women. ? 0-2 drinks a day for men.  Be aware of how much alcohol is in your drink. In the [place] one drink equals one 12 oz bottle of beer (355 mL), one 5 oz glass of wine (148 mL), or one 1 oz shot of hard liquor (44 mL). Eating and drinking   Eat foods that are high in fiber, such as fruits, vegetables, and whole grains.  Eat foods that are high in calcium and vitamin D, such as milk, cheese, yogurt, eggs, liver, fish, and broccoli.  Limit foods that are high in fat, such as fried foods and desserts.  Limit the amount of red meat and processed meat you eat, such as hot dogs, sausage, bacon, and lunch meats. General instructions  Keep all follow-up visits as told by your health care provider. This is important. ? This includes having regularly scheduled colonoscopies. ? Talk to your health care provider about when you need a colonoscopy. Contact a health care provider if:  You have new or  worsening bleeding during a bowel movement.  You have new or increased blood in your stool.  You have a change in bowel habits.  You lose weight for no known reason. Summary  Polyps are tissue growths inside the body. Polyps can grow in many places, including the colon.  Most colon polyps are noncancerous (benign), but some can become cancerous over time.  This condition is diagnosed with a colonoscopy.  Treatment for this condition involves removing any polyps that are found. Most polyps can be removed during a colonoscopy. This information is not intended to replace advice given to you by your health care provider. Make sure you discuss any questions you have with your health care provider. Document Revised: 12/07/2017 Document Reviewed: 12/07/2017 Elsevier Patient Education  [employer].    2 polyps removed today  Further recommendations to follow pending review of pathology report  At patient request, I called  Orlando Devereux at [phone] -no contact

## 2020-08-06 ENCOUNTER — Encounter: Payer: Self-pay | Admitting: Internal Medicine

## 2020-08-06 LAB — SURGICAL PATHOLOGY

## 2020-08-10 ENCOUNTER — Encounter (HOSPITAL_COMMUNITY): Payer: Self-pay | Admitting: Internal Medicine

## 2020-09-25 ENCOUNTER — Other Ambulatory Visit: Payer: Self-pay

## 2020-09-25 ENCOUNTER — Ambulatory Visit: Payer: 59 | Admitting: Physician Assistant

## 2020-09-25 ENCOUNTER — Encounter: Payer: Self-pay | Admitting: Physician Assistant

## 2020-09-25 DIAGNOSIS — L570 Actinic keratosis: Secondary | ICD-10-CM

## 2020-09-25 DIAGNOSIS — Z1283 Encounter for screening for malignant neoplasm of skin: Secondary | ICD-10-CM

## 2020-10-09 ENCOUNTER — Encounter: Payer: Self-pay | Admitting: Physician Assistant

## 2020-10-27 NOTE — Progress Notes (Signed)
   Follow-Up Visit   Subjective  Ryan Jennings is a 60 y.o. male who presents for the following: Annual Exam (No real concerns).   The following portions of the chart were reviewed this encounter and updated as appropriate:  Tobacco  Allergies  Meds  Problems  Med Hx  Surg Hx  Fam Hx      Objective  Well appearing patient in no apparent distress; mood and affect are within normal limits.  A full examination was performed including scalp, head, eyes, ears, nose, lips, neck, chest, axillae, abdomen, back, buttocks, bilateral upper extremities, bilateral lower extremities, hands, feet, fingers, toes, fingernails, and toenails. All findings within normal limits unless otherwise noted below.  Objective  waist up: No atypical nevi No signs of non-mole skin cancer.   Objective  Mid Frontal Scalp: Erythematous patches with gritty scale.  Objective  waist up: Waist up skin examination- no atypical moles or non mole skin cancer   Assessment & Plan  Screening exam for skin cancer waist up  Yearly skin exams  AK (actinic keratosis) Mid Frontal Scalp  Destruction of lesion - Mid Frontal Scalp Complexity: simple   Destruction method: cryotherapy   Informed consent: discussed and consent obtained   Timeout:  patient name, date of birth, surgical site, and procedure verified Lesion destroyed using liquid nitrogen: Yes   Cryotherapy cycles:  3 Outcome: patient tolerated procedure well with no complications    Encounter for screening for malignant neoplasm of skin waist up  Yearly skin check    I, Lugene Hitt, PA-C, have reviewed all documentation's for this visit.  The documentation on 10/27/20 for the exam, diagnosis, procedures and orders are all accurate and complete.

## 2021-09-28 ENCOUNTER — Other Ambulatory Visit: Payer: Self-pay

## 2021-09-28 ENCOUNTER — Encounter: Payer: Self-pay | Admitting: Physician Assistant

## 2021-09-28 ENCOUNTER — Ambulatory Visit: Payer: 59 | Admitting: Physician Assistant

## 2021-09-28 DIAGNOSIS — L570 Actinic keratosis: Secondary | ICD-10-CM

## 2021-09-28 MED ORDER — TOLAK 4 % EX CREA: TOPICAL_CREAM | CUTANEOUS | 1 refills | Status: DC

## 2021-09-29 NOTE — Progress Notes (Signed)
° °  Follow-Up Visit   Subjective  Ryan Jennings is a 61 y.o. male who presents for the following: Annual Exam (No new concerns- personal and family history of non mole skin cancer, but no melanoma. ).   The following portions of the chart were reviewed this encounter and updated as appropriate:  Tobacco   Allergies   Meds   Problems   Med Hx   Surg Hx   Fam Hx       Objective  Well appearing patient in no apparent distress; mood and affect are within normal limits.  All skin waist up examined.  Left Frontal Scalp, Left Superior Helix, Left Temple, Right Frontal Scalp, Right Superior Helix Erythematous patches with gritty scale.   Assessment & Plan  AK (actinic keratosis) (5) Left Superior Helix; Right Superior Helix; Left Frontal Scalp; Right Frontal Scalp; Left Temple  Destruction of lesion - Left Frontal Scalp, Left Superior Helix, Left Temple, Right Frontal Scalp, Right Superior Helix Complexity: simple   Destruction method: cryotherapy   Informed consent: discussed and consent obtained   Timeout:  patient name, date of birth, surgical site, and procedure verified Lesion destroyed using liquid nitrogen: Yes   Cryotherapy cycles:  3 Outcome: patient tolerated procedure well with no complications    Fluorouracil (TOLAK) 4 % CREA - Left Frontal Scalp, Left Superior Helix, Left Temple, Right Frontal Scalp, Right Superior Helix Apply to affected area qhs Monday - Sunday x 2 weeks- expect irritation and avoid sunlight    I, Kenitha Glendinning, PA-C, have reviewed all documentation's for this visit.  The documentation on 09/29/21 for the exam, diagnosis, procedures and orders are all accurate and complete.

## 2022-09-28 ENCOUNTER — Ambulatory Visit: Payer: 59 | Admitting: Physician Assistant

## 2022-10-31 ENCOUNTER — Ambulatory Visit: Payer: 59 | Attending: Neurological Surgery

## 2022-10-31 ENCOUNTER — Other Ambulatory Visit: Payer: Self-pay

## 2022-10-31 DIAGNOSIS — M62838 Other muscle spasm: Secondary | ICD-10-CM | POA: Insufficient documentation

## 2022-10-31 DIAGNOSIS — M542 Cervicalgia: Secondary | ICD-10-CM | POA: Insufficient documentation

## 2022-10-31 NOTE — Therapy (Signed)
OUTPATIENT PHYSICAL THERAPY CERVICAL EVALUATION   Patient Name: Ryan Jennings MRN: [id number] DOB:1961/02/11, 62 y.o., male Today's Date: 10/31/2022  END OF SESSION:  PT End of Session - 10/31/22 0807     Visit Number 1    Number of Visits 8    Date for PT Re-Evaluation 12/30/22    PT Start Time 0817    PT Stop Time 0900    PT Time Calculation (min) 43 min    Activity Tolerance Patient tolerated treatment well    Behavior During Therapy [facility] for tasks assessed/performed             Past Medical History:  Diagnosis Date   Adenomatous colon polyp 2010   Esophageal ring    2010, 2012   GERD (gastroesophageal reflux disease)    Hyperlipidemia    Nephrolithiasis    PONV (postoperative nausea and vomiting)    PONV (postoperative nausea and vomiting)    Traumatic rupture of right distal biceps tendon    Past Surgical History:  Procedure Laterality Date   APPENDECTOMY  8/11   BICEPS TENDON REPAIR     bilateral   COLONOSCOPY  04/2009   sigmoid adenomatous polyp, next colonoscopy 04/2014   COLONOSCOPY N/A 03/24/2014   Procedure: COLONOSCOPY;  Surgeon: [name], multiple rectal and colonic polyps, abnormal cecum (query Goody powder effect) s/p biopsy.  Tubular adenomas on pathology with benign colonic mucosal biopsy with inflammation likely secondary to NSAIDs.  Recommend repeat colonoscopy in 5 years.   COLONOSCOPY N/A 08/05/2020   Procedure: COLONOSCOPY;  Surgeon: [name], MD;  [facility]: [facility];  Service: Endoscopy;  Laterality: N/A;  8:15am   DISTAL BICEPS TENDON REPAIR Right 06/03/2015   Procedure: RIGHT DISTAL BICEPS TENDON REPAIR;  Surgeon: Elsie Saas, MD;  Location: [facility];  Service: Orthopedics;  Laterality: Right;   ESOPHAGOGASTRODUODENOSCOPY  04/2009   distal erosive reflux esophagitis, schatzki ring s/p 39F, hh, couple of antral erosions   ESOPHAGOGASTRODUODENOSCOPY  05/2011   Dr. [name]: erosive reflux esophagitis, superimposed  on Schatzki's ring and early stricture s/p 54 F, small hiatal hernia   HERNIA REPAIR     x 3   POLYPECTOMY  08/05/2020   Procedure: POLYPECTOMY;  Surgeon: [name], MD;  Location: [facility];  Service: Endoscopy;;   ROTATOR CUFF REPAIR     right   Patient Active Problem List   Diagnosis Date Noted   History of adenomatous polyp of colon 05/01/2020   Traumatic rupture of right distal biceps tendon    PONV (postoperative nausea and vomiting)    Adenomatous colon polyp 03/17/2014   Esophageal dysphagia 05/06/2011   GERD (gastroesophageal reflux disease) 05/06/2011    PCP: Haywood Pao, MD  REFERRING PROVIDER: Kristeen Miss, MD   REFERRING DIAG: Neck pain   THERAPY DIAG:  Cervicalgia  Rationale for Evaluation and Treatment: Rehabilitation  ONSET DATE: a few months  SUBJECTIVE:  SUBJECTIVE STATEMENT: Patient reports that his neck and shoulders have been bothering him for a little bit. His pain is primarily in his neck and right shoulder. However, he has experienced pain and numbness into his thumb and first finger of the right hand. He is right hand dominant.   PERTINENT HISTORY:  History of right rotator cuff repair  PAIN:  Are you having pain? Yes: NPRS scale: 2-3/10 Pain location: neck and right shoulder Pain description: numbness (intermittent), tight Aggravating factors: looking up and repetitive motions Relieving factors: medication  PRECAUTIONS: None  WEIGHT BEARING RESTRICTIONS: No  FALLS:  Has patient fallen in last 6 months? No  LIVING ENVIRONMENT: Lives with: lives with their family Lives in: House/apartment Has following equipment at home: None  OCCUPATION: retired  PLOF: Independent  PATIENT GOALS: reduced pain, tightness, and numbness,  play golf, and play with grandchildren  NEXT MD VISIT: none scheduled  OBJECTIVE:   PATIENT SURVEYS:  FOTO 67.67  COGNITION: Overall cognitive status: Within functional limits for tasks assessed  SENSATION: Patient reports intermittent numbness in 1st and 2nd fingers of right hand  POSTURE: forward head  PALPATION: TTP: right upper trapezius, supraspinatus, infraspinatus, and scapular stabilizers with multiple trigger points throughout these muscles   CERVICAL ROM:   Active ROM A/PROM (deg) eval  Flexion 50  Extension 34; "tight"  Right lateral flexion 36  Left lateral flexion 26  Right rotation 73  Left rotation 74   (Blank rows = not tested)  UPPER EXTREMITY ROM:  Active ROM Right eval Left eval  Shoulder flexion 150 150  Shoulder extension    Shoulder abduction 120; "tight"  120  Shoulder adduction    Shoulder extension    Shoulder internal rotation    Shoulder external rotation    Elbow flexion    Elbow extension    Wrist flexion    Wrist extension    Wrist ulnar deviation    Wrist radial deviation    Wrist pronation    Wrist supination     (Blank rows = not tested)  UPPER EXTREMITY MMT:  MMT Right eval Left eval  Shoulder flexion 4+/5; "tight"  4+/5  Shoulder extension    Shoulder abduction 4/5; "tight"  4/5  Shoulder adduction    Shoulder extension    Shoulder internal rotation    Shoulder external rotation    Middle trapezius    Lower trapezius    Elbow flexion 5/5 5/5  Elbow extension 4+/5 4+/5  Wrist flexion    Wrist extension    Wrist ulnar deviation    Wrist radial deviation    Wrist pronation    Wrist supination    Grip strength 75 78   (Blank rows = not tested)  CERVICAL SPECIAL TESTS:  Spurling's test: Negative, Distraction test: Positive, and Sharp pursor's test: Negative  TODAY'S TREATMENT:  DATE:                                      2/26 EXERCISE LOG  Exercise Repetitions and Resistance Comments  Self STM with tennis ball     Scapular retraction    SNAG for extension    Self distraction         Blank cell = exercise not performed today   PATIENT EDUCATION:  Education details: HEP, POC, prognosis, healing, referred pain, and goals for therapy Person educated: Patient Education method: Explanation Education comprehension: verbalized understanding  HOME EXERCISE PROGRAM: Today's interventions for provided as a HEP. He was recommended to complete these interventions twice per day for about 2 minutes each. He reported understanding.   ASSESSMENT:  CLINICAL IMPRESSION: Patient is a 62 y.o. male who was seen today for physical therapy evaluation and treatment for cervicalgia. He presented with low pain severity and irritability with cervical extension and right shoulder manual muscle testing reproducing his familiar tightness. His familiar numbness was unable to be reproduced with any of today's assessments. Recommend that he continue with skilled physical therapy to address his impairments to return to his prior level of function.   OBJECTIVE IMPAIRMENTS: decreased activity tolerance, decreased ROM, decreased strength, impaired tone, impaired UE functional use, postural dysfunction, and pain.   ACTIVITY LIMITATIONS: lifting and reach over head  PARTICIPATION LIMITATIONS: community activity and yard work  PERSONAL FACTORS: Time since onset of injury/illness/exacerbation are also affecting patient's functional outcome.   REHAB POTENTIAL: Good  CLINICAL DECISION MAKING: Stable/uncomplicated  EVALUATION COMPLEXITY: Low   GOALS: Goals reviewed with patient? Yes  LONG TERM GOALS: Target date: 11/28/22  Patient will be independent with his HEP.  Baseline:  Goal status: INITIAL  2.  Patient will be able to complete his daily activities without his familiar pain exceeding  1/10. Baseline:  Goal status: INITIAL  3.  Patient will be able to demonstrate at least 40 degrees of cervical extension without reproducing his familiar symptoms.  Baseline:  Goal status: INITIAL  PLAN:  PT FREQUENCY: 2x/week  PT DURATION: 4 weeks  PLANNED INTERVENTIONS: Therapeutic exercises, Therapeutic activity, Neuromuscular re-education, Patient/Family education, Joint mobilization, Joint manipulation, Dry Needling, Electrical stimulation, Spinal manipulation, Spinal mobilization, Cryotherapy, Moist heat, Vasopneumatic device, Traction, Manual therapy, and Re-evaluation  PLAN FOR NEXT SESSION: UBE, scapular stabilization, UE strengthening, manual therapy, assess for potential dry needling, and modalities as needed   Darlin Coco, PT 10/31/2022, 1:23 PM

## 2022-11-03 ENCOUNTER — Ambulatory Visit: Payer: 59 | Admitting: Physical Therapy

## 2022-11-03 DIAGNOSIS — M542 Cervicalgia: Secondary | ICD-10-CM | POA: Diagnosis not present

## 2022-11-03 DIAGNOSIS — M62838 Other muscle spasm: Secondary | ICD-10-CM

## 2022-11-03 NOTE — Therapy (Signed)
OUTPATIENT PHYSICAL THERAPY CERVICAL EVALUATION   Patient Name: Ryan Jennings MRN: [id number] DOB:[birth year], 62 y.o., male Today's Date: 11/03/2022  END OF SESSION:  PT End of Session - 11/03/22 0950     Visit Number 2    Number of Visits 8    Date for PT Re-Evaluation 12/30/22    PT Start Time 0815    PT Stop Time 0905    PT Time Calculation (min) 50 min    Activity Tolerance Patient tolerated treatment well    Behavior During Therapy [facility] for tasks assessed/performed             Past Medical History:  Diagnosis Date   Adenomatous colon polyp 2010   Esophageal ring    2010, 2012   GERD (gastroesophageal reflux disease)    Hyperlipidemia    Nephrolithiasis    PONV (postoperative nausea and vomiting)    PONV (postoperative nausea and vomiting)    Traumatic rupture of right distal biceps tendon    Past Surgical History:  Procedure Laterality Date   APPENDECTOMY  8/11   BICEPS TENDON REPAIR     bilateral   COLONOSCOPY  04/2009   sigmoid adenomatous polyp, next colonoscopy 04/2014   COLONOSCOPY N/A 03/24/2014   Procedure: COLONOSCOPY;  Surgeon: [name], multiple rectal and colonic polyps, abnormal cecum (query Goody powder effect) s/p biopsy.  Tubular adenomas on pathology with benign colonic mucosal biopsy with inflammation likely secondary to NSAIDs.  Recommend repeat colonoscopy in 5 years.   COLONOSCOPY N/A 08/05/2020   Procedure: COLONOSCOPY;  Surgeon: [name], MD;  [facility];  Service: Endoscopy;  Laterality: N/A;  8:15am   DISTAL BICEPS TENDON REPAIR Right 06/03/2015   Procedure: RIGHT DISTAL BICEPS TENDON REPAIR;  Surgeon: Elsie Saas, MD;  Location: [place];  Service: Orthopedics;  Laterality: Right;   ESOPHAGOGASTRODUODENOSCOPY  04/2009   distal erosive reflux esophagitis, schatzki ring s/p 73F, hh, couple of antral erosions   ESOPHAGOGASTRODUODENOSCOPY  05/2011   Dr. [name]: erosive reflux esophagitis, superimposed  on Schatzki's ring and early stricture s/p 54 F, small hiatal hernia   HERNIA REPAIR     x 3   POLYPECTOMY  08/05/2020   Procedure: POLYPECTOMY;  Surgeon: [name], MD;  [facility];  Service: Endoscopy;;   ROTATOR CUFF REPAIR     right   Patient Active Problem List   Diagnosis Date Noted   History of adenomatous polyp of colon 05/01/2020   Traumatic rupture of right distal biceps tendon    PONV (postoperative nausea and vomiting)    Adenomatous colon polyp 03/17/2014   Esophageal dysphagia 05/06/2011   GERD (gastroesophageal reflux disease) 05/06/2011    PCP: Haywood Pao, MD  REFERRING PROVIDER: Kristeen Miss, MD   REFERRING DIAG: Neck pain   THERAPY DIAG:  Cervicalgia  Other muscle spasm  Rationale for Evaluation and Treatment: Rehabilitation  ONSET DATE: a few months  SUBJECTIVE:  SUBJECTIVE STATEMENT: No new complaints. PERTINENT HISTORY:  History of right rotator cuff repair  PAIN:  Are you having pain? Yes: NPRS scale: 2-3/10 Pain location: neck and right shoulder Pain description: numbness (intermittent), tight Aggravating factors: looking up and repetitive motions Relieving factors: medication  PRECAUTIONS: None  WEIGHT BEARING RESTRICTIONS: No  FALLS:  Has patient fallen in last 6 months? No  LIVING ENVIRONMENT: Lives with: lives with their family Lives in: House/apartment Has following equipment at home: None  OCCUPATION: retired  PLOF: Independent  PATIENT GOALS: reduced pain, tightness, and numbness, play golf, and play with grandchildren  NEXT MD VISIT: none scheduled  OBJECTIVE:   PATIENT SURVEYS:  FOTO 67.67  COGNITION: Overall cognitive status: Within functional limits for tasks  assessed  SENSATION: Patient reports intermittent numbness in 1st and 2nd fingers of right hand  POSTURE: forward head  PALPATION: TTP: right upper trapezius, supraspinatus, infraspinatus, and scapular stabilizers with multiple trigger points throughout these muscles   CERVICAL ROM:   Active ROM A/PROM (deg) eval  Flexion 50  Extension 34; "tight"  Right lateral flexion 36  Left lateral flexion 26  Right rotation 73  Left rotation 74   (Blank rows = not tested)  UPPER EXTREMITY ROM:  Active ROM Right eval Left eval  Shoulder flexion 150 150  Shoulder extension    Shoulder abduction 120; "tight"  120  Shoulder adduction    Shoulder extension    Shoulder internal rotation    Shoulder external rotation    Elbow flexion    Elbow extension    Wrist flexion    Wrist extension    Wrist ulnar deviation    Wrist radial deviation    Wrist pronation    Wrist supination     (Blank rows = not tested)  UPPER EXTREMITY MMT:  MMT Right eval Left eval  Shoulder flexion 4+/5; "tight"  4+/5  Shoulder extension    Shoulder abduction 4/5; "tight"  4/5  Shoulder adduction    Shoulder extension    Shoulder internal rotation    Shoulder external rotation    Middle trapezius    Lower trapezius    Elbow flexion 5/5 5/5  Elbow extension 4+/5 4+/5  Wrist flexion    Wrist extension    Wrist ulnar deviation    Wrist radial deviation    Wrist pronation    Wrist supination    Grip strength 75 78   (Blank rows = not tested)  CERVICAL SPECIAL TESTS:  Spurling's test: Negative, Distraction test: Positive, and Sharp pursor's test: Negative  TODAY'S TREATMENT:                                                                                                                              DATE:             11/03/22:  Combo e'stim/US at 1.50 W/CM2 x 12 minutes to patient's right UT f/b STW/M x 12 minutes to  right UT/Lev Scap including ischemic release technique f/b HMP and IFC at 80-150  on 40% scan x 20 minutes.  Patient tolerated treatment without complaint with normal modality response following removal of modality.    PATIENT EDUCATION:  Reviewed towel extension exercise and instruct in chin tucks and cervical extension.  HOME EXERCISE PROGRAM: Today's interventions for provided as a HEP. He was recommended to complete these interventions twice per day for about 2 minutes each. He reported understanding.   ASSESSMENT:  CLINICAL IMPRESSION: Notable right UT and increased tone over right Levator Scapulae musculature.  Good response to treatment today.  Provided patietn with handout on dry needling.  OBJECTIVE IMPAIRMENTS: decreased activity tolerance, decreased ROM, decreased strength, impaired tone, impaired UE functional use, postural dysfunction, and pain.   ACTIVITY LIMITATIONS: lifting and reach over head  PARTICIPATION LIMITATIONS: community activity and yard work  PERSONAL FACTORS: Time since onset of injury/illness/exacerbation are also affecting patient's functional outcome.   REHAB POTENTIAL: Good  CLINICAL DECISION MAKING: Stable/uncomplicated  EVALUATION COMPLEXITY: Low   GOALS: Goals reviewed with patient? Yes  LONG TERM GOALS: Target date: 11/28/22  Patient will be independent with his HEP.  Baseline:  Goal status: INITIAL  2.  Patient will be able to complete his daily activities without his familiar pain exceeding 1/10. Baseline:  Goal status: INITIAL  3.  Patient will be able to demonstrate at least 40 degrees of cervical extension without reproducing his familiar symptoms.  Baseline:  Goal status: INITIAL  PLAN:  PT FREQUENCY: 2x/week  PT DURATION: 4 weeks  PLANNED INTERVENTIONS: Therapeutic exercises, Therapeutic activity, Neuromuscular re-education, Patient/Family education, Joint mobilization, Joint manipulation, Dry Needling, Electrical stimulation, Spinal manipulation, Spinal mobilization, Cryotherapy, Moist heat,  Vasopneumatic device, Traction, Manual therapy, and Re-evaluation  PLAN FOR NEXT SESSION: UBE, scapular stabilization, UE strengthening, manual therapy, assess for potential dry needling, and modalities as needed   Andrez Lieurance, [place], PT 11/03/2022, 10:09 AM

## 2022-11-07 ENCOUNTER — Ambulatory Visit: Payer: 59 | Attending: Neurological Surgery | Admitting: Physical Therapy

## 2022-11-07 DIAGNOSIS — M62838 Other muscle spasm: Secondary | ICD-10-CM

## 2022-11-07 DIAGNOSIS — M542 Cervicalgia: Secondary | ICD-10-CM | POA: Diagnosis present

## 2022-11-07 NOTE — Therapy (Signed)
OUTPATIENT PHYSICAL THERAPY CERVICAL EVALUATION   Patient Name: Ryan Jennings MRN: [id number] DOB:December 25, 1960, 62 y.o., male Today's Date: 11/07/2022  END OF SESSION:  PT End of Session - 11/07/22 0930     Visit Number 3    Date for PT Re-Evaluation 12/30/22    PT Start Time 0815    Activity Tolerance Patient tolerated treatment well    Behavior During Therapy [facility] for tasks assessed/performed             Past Medical History:  Diagnosis Date   Adenomatous colon polyp 2010   Esophageal ring    2010, 2012   GERD (gastroesophageal reflux disease)    Hyperlipidemia    Nephrolithiasis    PONV (postoperative nausea and vomiting)    PONV (postoperative nausea and vomiting)    Traumatic rupture of right distal biceps tendon    Past Surgical History:  Procedure Laterality Date   APPENDECTOMY  8/11   BICEPS TENDON REPAIR     bilateral   COLONOSCOPY  04/2009   sigmoid adenomatous polyp, next colonoscopy 04/2014   COLONOSCOPY N/A 03/24/2014   Procedure: COLONOSCOPY;  Surgeon: [name], multiple rectal and colonic polyps, abnormal cecum (query Goody powder effect) s/p biopsy.  Tubular adenomas on pathology with benign colonic mucosal biopsy with inflammation likely secondary to NSAIDs.  Recommend repeat colonoscopy in 5 years.   COLONOSCOPY N/A 08/05/2020   Procedure: COLONOSCOPY;  Surgeon: [name], MD;  [facility];  Service: Endoscopy;  Laterality: N/A;  8:15am   DISTAL BICEPS TENDON REPAIR Right 06/03/2015   Procedure: RIGHT DISTAL BICEPS TENDON REPAIR;  Surgeon: Elsie Saas, MD;  Location: [place];  Service: Orthopedics;  Laterality: Right;   ESOPHAGOGASTRODUODENOSCOPY  04/2009   distal erosive reflux esophagitis, schatzki ring s/p 74F, hh, couple of antral erosions   ESOPHAGOGASTRODUODENOSCOPY  05/2011   Dr. [name]: erosive reflux esophagitis, superimposed on Schatzki's ring and early stricture s/p 54 F, small hiatal hernia   HERNIA  REPAIR     x 3   POLYPECTOMY  08/05/2020   Procedure: POLYPECTOMY;  Surgeon: [name], MD;  [facility];  Service: Endoscopy;;   ROTATOR CUFF REPAIR     right   Patient Active Problem List   Diagnosis Date Noted   History of adenomatous polyp of colon 05/01/2020   Traumatic rupture of right distal biceps tendon    PONV (postoperative nausea and vomiting)    Adenomatous colon polyp 03/17/2014   Esophageal dysphagia 05/06/2011   GERD (gastroesophageal reflux disease) 05/06/2011    PCP: Haywood Pao, MD  REFERRING PROVIDER: Kristeen Miss, MD   REFERRING DIAG: Neck pain   THERAPY DIAG:  Cervicalgia  Other muscle spasm  Rationale for Evaluation and Treatment: Rehabilitation  ONSET DATE: a few months  SUBJECTIVE:  SUBJECTIVE STATEMENT: Sore after last treatment but better now.  PERTINENT HISTORY:  History of right rotator cuff repair  PAIN:  Are you having pain? Yes: NPRS scale: 2/10 Pain location: neck and right shoulder Pain description: numbness (intermittent), tight Aggravating factors: looking up and repetitive motions Relieving factors: medication   OBJECTIVE:      TODAY'S TREATMENT:                                                                                                                              DATE:  Reviewed HEP.           11/07/22:  Combo e'stim/US at 1.50 W/CM2 x 12 minutes to patient's right UT f/b STW/M x 12 minutes to right UT/Lev Scap including ischemic release technique f/b HMP and IFC at 80-150 on 40% scan x 20 minutes.  Patient tolerated treatment without complaint with normal modality response following removal of modality.     ASSESSMENT:  CLINICAL IMPRESSION: Good response to treatments thus far.  Minimal pain  upon presentation to clinic and less following treatment.  OBJECTIVE IMPAIRMENTS: decreased activity tolerance, decreased ROM, decreased strength, impaired tone, impaired UE functional use, postural dysfunction, and pain.   ACTIVITY LIMITATIONS: lifting and reach over head  PARTICIPATION LIMITATIONS: community activity and yard work  PERSONAL FACTORS: Time since onset of injury/illness/exacerbation are also affecting patient's functional outcome.   REHAB POTENTIAL: Good  CLINICAL DECISION MAKING: Stable/uncomplicated  EVALUATION COMPLEXITY: Low   GOALS: Goals reviewed with patient? Yes  LONG TERM GOALS: Target date: 11/28/22  Patient will be independent with his HEP.  Baseline:  Goal status: INITIAL  2.  Patient will be able to complete his daily activities without his familiar pain exceeding 1/10. Baseline:  Goal status: INITIAL  3.  Patient will be able to demonstrate at least 40 degrees of cervical extension without reproducing his familiar symptoms.  Baseline:  Goal status: INITIAL  PLAN:  PT FREQUENCY: 2x/week  PT DURATION: 4 weeks  PLANNED INTERVENTIONS: Therapeutic exercises, Therapeutic activity, Neuromuscular re-education, Patient/Family education, Joint mobilization, Joint manipulation, Dry Needling, Electrical stimulation, Spinal manipulation, Spinal mobilization, Cryotherapy, Moist heat, Vasopneumatic device, Traction, Manual therapy, and Re-evaluation  PLAN FOR NEXT SESSION: UBE, scapular stabilization, UE strengthening, manual therapy, assess for potential dry needling, and modalities as needed   Audrinna Sherman, [place], PT 11/07/2022, 9:37 AM

## 2022-11-10 ENCOUNTER — Ambulatory Visit: Payer: 59

## 2022-11-10 DIAGNOSIS — M542 Cervicalgia: Secondary | ICD-10-CM | POA: Diagnosis not present

## 2022-11-10 NOTE — Therapy (Signed)
OUTPATIENT PHYSICAL THERAPY CERVICAL TREATMENT   Patient Name: Ryan Jennings MRN: [id number] DOB:Aug 06, 1961, 62 y.o., male Today's Date: 11/10/2022  END OF SESSION:  PT End of Session - 11/10/22 0817     Visit Number 4    Number of Visits 8    Date for PT Re-Evaluation 12/30/22    PT Start Time 0815    PT Stop Time 0910    PT Time Calculation (min) 55 min    Activity Tolerance Patient tolerated treatment well    Behavior During Therapy [facility] for tasks assessed/performed             Past Medical History:  Diagnosis Date   Adenomatous colon polyp 2010   Esophageal ring    2010, 2012   GERD (gastroesophageal reflux disease)    Hyperlipidemia    Nephrolithiasis    PONV (postoperative nausea and vomiting)    PONV (postoperative nausea and vomiting)    Traumatic rupture of right distal biceps tendon    Past Surgical History:  Procedure Laterality Date   APPENDECTOMY  8/11   BICEPS TENDON REPAIR     bilateral   COLONOSCOPY  04/2009   sigmoid adenomatous polyp, next colonoscopy 04/2014   COLONOSCOPY N/A 03/24/2014   Procedure: COLONOSCOPY;  Surgeon: [name], multiple rectal and colonic polyps, abnormal cecum (query Goody powder effect) s/p biopsy.  Tubular adenomas on pathology with benign colonic mucosal biopsy with inflammation likely secondary to NSAIDs.  Recommend repeat colonoscopy in 5 years.   COLONOSCOPY N/A 08/05/2020   Procedure: COLONOSCOPY;  Surgeon: [name], MD;  [facility];  Service: Endoscopy;  Laterality: N/A;  8:15am   DISTAL BICEPS TENDON REPAIR Right 06/03/2015   Procedure: RIGHT DISTAL BICEPS TENDON REPAIR;  Surgeon: Elsie Saas, MD;  Location: [place];  Service: Orthopedics;  Laterality: Right;   ESOPHAGOGASTRODUODENOSCOPY  04/2009   distal erosive reflux esophagitis, schatzki ring s/p 48F, hh, couple of antral erosions   ESOPHAGOGASTRODUODENOSCOPY  05/2011   Dr. [name]: erosive reflux esophagitis, superimposed  on Schatzki's ring and early stricture s/p 54 F, small hiatal hernia   HERNIA REPAIR     x 3   POLYPECTOMY  08/05/2020   Procedure: POLYPECTOMY;  Surgeon: [name], MD;  [facility];  Service: Endoscopy;;   ROTATOR CUFF REPAIR     right   Patient Active Problem List   Diagnosis Date Noted   History of adenomatous polyp of colon 05/01/2020   Traumatic rupture of right distal biceps tendon    PONV (postoperative nausea and vomiting)    Adenomatous colon polyp 03/17/2014   Esophageal dysphagia 05/06/2011   GERD (gastroesophageal reflux disease) 05/06/2011    PCP: Haywood Pao, MD  REFERRING PROVIDER: Kristeen Miss, MD   REFERRING DIAG: Neck pain   THERAPY DIAG:  Cervicalgia  Rationale for Evaluation and Treatment: Rehabilitation  ONSET DATE: a few months  SUBJECTIVE:  SUBJECTIVE STATEMENT: Patient reports that he still has a little pain and soreness, but it is getting better.   PERTINENT HISTORY:  History of right rotator cuff repair  PAIN:  Are you having pain? Yes: NPRS scale: "a little"/10 Pain location: neck and right shoulder Pain description: numbness (intermittent), tight Aggravating factors: looking up and repetitive motions Relieving factors: medication  PRECAUTIONS: None  WEIGHT BEARING RESTRICTIONS: No  FALLS:  Has patient fallen in last 6 months? No  LIVING ENVIRONMENT: Lives with: lives with their family Lives in: House/apartment Has following equipment at home: None  OCCUPATION: retired  PLOF: Independent  PATIENT GOALS: reduced pain, tightness, and numbness, play golf, and play with grandchildren  NEXT MD VISIT: none scheduled  OBJECTIVE:   PATIENT SURVEYS:  FOTO 67.67  COGNITION: Overall cognitive status: Within  functional limits for tasks assessed  SENSATION: Patient reports intermittent numbness in 1st and 2nd fingers of right hand  POSTURE: forward head  PALPATION: TTP: right upper trapezius, supraspinatus, infraspinatus, and scapular stabilizers with multiple trigger points throughout these muscles   CERVICAL ROM:   Active ROM A/PROM (deg) eval  Flexion 50  Extension 34; "tight"  Right lateral flexion 36  Left lateral flexion 26  Right rotation 73  Left rotation 74   (Blank rows = not tested)  UPPER EXTREMITY ROM:  Active ROM Right eval Left eval  Shoulder flexion 150 150  Shoulder extension    Shoulder abduction 120; "tight"  120  Shoulder adduction    Shoulder extension    Shoulder internal rotation    Shoulder external rotation    Elbow flexion    Elbow extension    Wrist flexion    Wrist extension    Wrist ulnar deviation    Wrist radial deviation    Wrist pronation    Wrist supination     (Blank rows = not tested)  UPPER EXTREMITY MMT:  MMT Right eval Left eval  Shoulder flexion 4+/5; "tight"  4+/5  Shoulder extension    Shoulder abduction 4/5; "tight"  4/5  Shoulder adduction    Shoulder extension    Shoulder internal rotation    Shoulder external rotation    Middle trapezius    Lower trapezius    Elbow flexion 5/5 5/5  Elbow extension 4+/5 4+/5  Wrist flexion    Wrist extension    Wrist ulnar deviation    Wrist radial deviation    Wrist pronation    Wrist supination    Grip strength 75 78   (Blank rows = not tested)  CERVICAL SPECIAL TESTS:  Spurling's test: Negative, Distraction test: Positive, and Sharp pursor's test: Negative  TODAY'S TREATMENT:                                                                                                                              DATE:  3/7 EXERCISE LOG  Exercise Repetitions and Resistance Comments  UBE X8 minutes   Horizontal ABD  Green t-band x 30 reps     Shoulder shrug  Blue t-band x 30 reps    Ressited ER Green t-band x 30 reps    Right UT stretch  4 x 30 seconds   Wall push up  20 reps     Blank cell = exercise not performed today  Manual Therapy Soft Tissue Mobilization: right upper trapezius, infraspinatus, and supraspinatus, for reduced pain and tone   Modalities  Date:  Unattended Estim: right upper trapezius and infraspinatus, IFC @ 80-150 Hz w/ 40% scan, 15 mins, Pain and Tone Hot Pack: right shoulder, 15 mins, Pain and Tone  PATIENT EDUCATION:  Education details: HEP, POC, prognosis, healing, referred pain, and goals for therapy Person educated: Patient Education method: Explanation Education comprehension: verbalized understanding  HOME EXERCISE PROGRAM: Today's interventions for provided as a HEP. He was recommended to complete these interventions twice per day for about 2 minutes each. He reported understanding.   ASSESSMENT:  CLINICAL IMPRESSION: Patient was introduced to multiple new interventions for improved rotator cuff strengthening. He required minimal cueing with today's new interventions for proper exercise performance. Manual therapy focused on soft tissue mobilization to his upper trapezius and infraspinatus for reduced pain and tone with moderate effectiveness. He reported feeling good upon the conclusion of treatment. He continues to require skilled physical therapy to address his remaining impairments to return to his prior level of function.   OBJECTIVE IMPAIRMENTS: decreased activity tolerance, decreased ROM, decreased strength, impaired tone, impaired UE functional use, postural dysfunction, and pain.   ACTIVITY LIMITATIONS: lifting and reach over head  PARTICIPATION LIMITATIONS: community activity and yard work  PERSONAL FACTORS: Time since onset of injury/illness/exacerbation are also affecting patient's functional outcome.   REHAB POTENTIAL: Good  CLINICAL DECISION MAKING:  Stable/uncomplicated  EVALUATION COMPLEXITY: Low   GOALS: Goals reviewed with patient? Yes  LONG TERM GOALS: Target date: 11/28/22  Patient will be independent with his HEP.  Baseline:  Goal status: INITIAL  2.  Patient will be able to complete his daily activities without his familiar pain exceeding 1/10. Baseline:  Goal status: INITIAL  3.  Patient will be able to demonstrate at least 40 degrees of cervical extension without reproducing his familiar symptoms.  Baseline:  Goal status: INITIAL  PLAN:  PT FREQUENCY: 2x/week  PT DURATION: 4 weeks  PLANNED INTERVENTIONS: Therapeutic exercises, Therapeutic activity, Neuromuscular re-education, Patient/Family education, Joint mobilization, Joint manipulation, Dry Needling, Electrical stimulation, Spinal manipulation, Spinal mobilization, Cryotherapy, Moist heat, Vasopneumatic device, Traction, Manual therapy, and Re-evaluation  PLAN FOR NEXT SESSION: UBE, scapular stabilization, UE strengthening, manual therapy, assess for potential dry needling, and modalities as needed   Darlin Coco, PT 11/10/2022, 12:24 PM

## 2022-11-14 ENCOUNTER — Ambulatory Visit: Payer: 59

## 2022-11-14 DIAGNOSIS — M542 Cervicalgia: Secondary | ICD-10-CM | POA: Diagnosis not present

## 2022-11-14 NOTE — Therapy (Signed)
OUTPATIENT PHYSICAL THERAPY CERVICAL TREATMENT   Patient Name: Ryan Jennings MRN: [id number] DOB:05/09/1961, 62 y.o., male Today's Date: 11/14/2022  END OF SESSION:  PT End of Session - 11/14/22 0825     Visit Number 5    Number of Visits 8    Date for PT Re-Evaluation 12/30/22    PT Start Time 0822   Patient arrived late for his appointment.   PT Stop Time 0908    PT Time Calculation (min) 46 min    Activity Tolerance Patient tolerated treatment well    Behavior During Therapy [facility] for tasks assessed/performed              Past Medical History:  Diagnosis Date   Adenomatous colon polyp 2010   Esophageal ring    2010, 2012   GERD (gastroesophageal reflux disease)    Hyperlipidemia    Nephrolithiasis    PONV (postoperative nausea and vomiting)    PONV (postoperative nausea and vomiting)    Traumatic rupture of right distal biceps tendon    Past Surgical History:  Procedure Laterality Date   APPENDECTOMY  8/11   BICEPS TENDON REPAIR     bilateral   COLONOSCOPY  04/2009   sigmoid adenomatous polyp, next colonoscopy 04/2014   COLONOSCOPY N/A 03/24/2014   Procedure: COLONOSCOPY;  Surgeon: [name], multiple rectal and colonic polyps, abnormal cecum (query Goody powder effect) s/p biopsy.  Tubular adenomas on pathology with benign colonic mucosal biopsy with inflammation likely secondary to NSAIDs.  Recommend repeat colonoscopy in 5 years.   COLONOSCOPY N/A 08/05/2020   Procedure: COLONOSCOPY;  Surgeon: [name], MD;  [facility];  Service: Endoscopy;  Laterality: N/A;  8:15am   DISTAL BICEPS TENDON REPAIR Right 06/03/2015   Procedure: RIGHT DISTAL BICEPS TENDON REPAIR;  Surgeon: Elsie Saas, MD;  Location: [place];  Service: Orthopedics;  Laterality: Right;   ESOPHAGOGASTRODUODENOSCOPY  04/2009   distal erosive reflux esophagitis, schatzki ring s/p 47F, hh, couple of antral erosions   ESOPHAGOGASTRODUODENOSCOPY  05/2011   Dr.  [name]: erosive reflux esophagitis, superimposed on Schatzki's ring and early stricture s/p 54 F, small hiatal hernia   HERNIA REPAIR     x 3   POLYPECTOMY  08/05/2020   Procedure: POLYPECTOMY;  Surgeon: [name], MD;  [facility];  Service: Endoscopy;;   ROTATOR CUFF REPAIR     right   Patient Active Problem List   Diagnosis Date Noted   History of adenomatous polyp of colon 05/01/2020   Traumatic rupture of right distal biceps tendon    PONV (postoperative nausea and vomiting)    Adenomatous colon polyp 03/17/2014   Esophageal dysphagia 05/06/2011   GERD (gastroesophageal reflux disease) 05/06/2011    PCP: Haywood Pao, MD  REFERRING PROVIDER: Kristeen Miss, MD   REFERRING DIAG: Neck pain   THERAPY DIAG:  Cervicalgia  Rationale for Evaluation and Treatment: Rehabilitation  ONSET DATE: a few months  SUBJECTIVE:  SUBJECTIVE STATEMENT: Patient reports that he feels good today.   PERTINENT HISTORY:  History of right rotator cuff repair  PAIN:  Are you having pain? Yes: NPRS scale: 0/10 Pain location: neck and right shoulder Pain description: numbness (intermittent), tight Aggravating factors: looking up and repetitive motions Relieving factors: medication  PRECAUTIONS: None  WEIGHT BEARING RESTRICTIONS: No  FALLS:  Has patient fallen in last 6 months? No  LIVING ENVIRONMENT: Lives with: lives with their family Lives in: House/apartment Has following equipment at home: None  OCCUPATION: retired  PLOF: Independent  PATIENT GOALS: reduced pain, tightness, and numbness, play golf, and play with grandchildren  NEXT MD VISIT: none scheduled  OBJECTIVE:   PATIENT SURVEYS:  FOTO 67.67 on 11/14/22  COGNITION: Overall cognitive status:  Within functional limits for tasks assessed  SENSATION: Patient reports intermittent numbness in 1st and 2nd fingers of right hand  POSTURE: forward head  PALPATION: TTP: right upper trapezius, supraspinatus, infraspinatus, and scapular stabilizers with multiple trigger points throughout these muscles   CERVICAL ROM:   Active ROM A/PROM (deg) eval  Flexion 50  Extension 34; "tight"  Right lateral flexion 36  Left lateral flexion 26  Right rotation 73  Left rotation 74   (Blank rows = not tested)  UPPER EXTREMITY ROM:  Active ROM Right eval Left eval  Shoulder flexion 150 150  Shoulder extension    Shoulder abduction 120; "tight"  120  Shoulder adduction    Shoulder extension    Shoulder internal rotation    Shoulder external rotation    Elbow flexion    Elbow extension    Wrist flexion    Wrist extension    Wrist ulnar deviation    Wrist radial deviation    Wrist pronation    Wrist supination     (Blank rows = not tested)  UPPER EXTREMITY MMT:  MMT Right eval Left eval  Shoulder flexion 4+/5; "tight"  4+/5  Shoulder extension    Shoulder abduction 4/5; "tight"  4/5  Shoulder adduction    Shoulder extension    Shoulder internal rotation    Shoulder external rotation    Middle trapezius    Lower trapezius    Elbow flexion 5/5 5/5  Elbow extension 4+/5 4+/5  Wrist flexion    Wrist extension    Wrist ulnar deviation    Wrist radial deviation    Wrist pronation    Wrist supination    Grip strength 75 78   (Blank rows = not tested)  CERVICAL SPECIAL TESTS:  Spurling's test: Negative, Distraction test: Positive, and Sharp pursor's test: Negative  TODAY'S TREATMENT:                                                                                                                              DATE:  3/11 EXERCISE LOG  Exercise Repetitions and Resistance Comments  UBE  X10 minutes @ 60 RPM   Resisted pull down  Blue  XTS x 2.5 minutes   R UE D1 & D2  Blue XTS x 3 minutes each    Bilateral shoulder ER Green t-band x 30 reps    Horizontal ABD  Green t-band x 30 reps    Rhomboid stretch  4 x 30 seconds    Blank cell = exercise not performed today  Modalities: no redness or adverse reaction to today's modalities  Date:  Unattended Estim: right upper trapezius, pre mod @ 80-150 Hz, 15 mins, Pain and Tone Hot Pack: Shoulder, 15 mins, Pain and Tone                                   3/7 EXERCISE LOG  Exercise Repetitions and Resistance Comments  UBE X8 minutes   Horizontal ABD  Green t-band x 30 reps    Shoulder shrug  Blue t-band x 30 reps    Ressited ER Green t-band x 30 reps    Right UT stretch  4 x 30 seconds   Wall push up  20 reps     Blank cell = exercise not performed today  Manual Therapy Soft Tissue Mobilization: right upper trapezius, infraspinatus, and supraspinatus, for reduced pain and tone   Modalities  Date:  Unattended Estim: right upper trapezius and infraspinatus, IFC @ 80-150 Hz w/ 40% scan, 15 mins, Pain and Tone Hot Pack: right shoulder, 15 mins, Pain and Tone  PATIENT EDUCATION:  Education details: HEP, POC, prognosis, healing, referred pain, and goals for therapy Person educated: Patient Education method: Explanation Education comprehension: verbalized understanding  HOME EXERCISE PROGRAM: Today's interventions for provided as a HEP. He was recommended to complete these interventions twice per day for about 2 minutes each. He reported understanding.   ASSESSMENT:  CLINICAL IMPRESSION: Patient was introduced to PNF D1 and D2 patterns with moderate difficulty and fatigue. He required minimal cueing with these interventions for proper biomechanics. He experienced no significant increase in pain or discomfort with any of today's interventions. He reported feeling good upon the conclusion of treatment. He continues to require skilled physical therapy to address his remaining  impairments to return to his prior level of function.   OBJECTIVE IMPAIRMENTS: decreased activity tolerance, decreased ROM, decreased strength, impaired tone, impaired UE functional use, postural dysfunction, and pain.   ACTIVITY LIMITATIONS: lifting and reach over head  PARTICIPATION LIMITATIONS: community activity and yard work  PERSONAL FACTORS: Time since onset of injury/illness/exacerbation are also affecting patient's functional outcome.   REHAB POTENTIAL: Good  CLINICAL DECISION MAKING: Stable/uncomplicated  EVALUATION COMPLEXITY: Low   GOALS: Goals reviewed with patient? Yes  LONG TERM GOALS: Target date: 11/28/22  Patient will be independent with his HEP.  Baseline:  Goal status: INITIAL  2.  Patient will be able to complete his daily activities without his familiar pain exceeding 1/10. Baseline:  Goal status: INITIAL  3.  Patient will be able to demonstrate at least 40 degrees of cervical extension without reproducing his familiar symptoms.  Baseline:  Goal status: INITIAL  PLAN:  PT FREQUENCY: 2x/week  PT DURATION: 4 weeks  PLANNED INTERVENTIONS: Therapeutic exercises, Therapeutic activity, Neuromuscular re-education, Patient/Family education, Joint mobilization, Joint manipulation, Dry Needling, Electrical stimulation, Spinal manipulation, Spinal mobilization, Cryotherapy, Moist heat, Vasopneumatic device, Traction, Manual therapy, and Re-evaluation  PLAN  FOR NEXT SESSION: UBE, scapular stabilization, UE strengthening, manual therapy, assess for potential dry needling, and modalities as needed   Darlin Coco, PT 11/14/2022, 3:23 PM

## 2022-11-16 ENCOUNTER — Ambulatory Visit: Payer: 59

## 2022-11-16 DIAGNOSIS — M542 Cervicalgia: Secondary | ICD-10-CM | POA: Diagnosis not present

## 2022-11-16 NOTE — Therapy (Signed)
OUTPATIENT PHYSICAL THERAPY CERVICAL TREATMENT   Patient Name: Ryan Jennings MRN: [id number] DOB:07-23-1961, 62 y.o., male Today's Date: 11/16/2022  END OF SESSION:  PT End of Session - 11/16/22 0825     Visit Number 6    Number of Visits 8    Date for PT Re-Evaluation 12/30/22    PT Start Time [phone]   Patient arrived late to his appointment.   PT Stop Time 0900    PT Time Calculation (min) 34 min    Activity Tolerance Patient tolerated treatment well    Behavior During Therapy [facility] for tasks assessed/performed              Past Medical History:  Diagnosis Date   Adenomatous colon polyp 2010   Esophageal ring    2010, 2012   GERD (gastroesophageal reflux disease)    Hyperlipidemia    Nephrolithiasis    PONV (postoperative nausea and vomiting)    PONV (postoperative nausea and vomiting)    Traumatic rupture of right distal biceps tendon    Past Surgical History:  Procedure Laterality Date   APPENDECTOMY  8/11   BICEPS TENDON REPAIR     bilateral   COLONOSCOPY  04/2009   sigmoid adenomatous polyp, next colonoscopy 04/2014   COLONOSCOPY N/A 03/24/2014   Procedure: COLONOSCOPY;  Surgeon: [name], multiple rectal and colonic polyps, abnormal cecum (query Goody powder effect) s/p biopsy.  Tubular adenomas on pathology with benign colonic mucosal biopsy with inflammation likely secondary to NSAIDs.  Recommend repeat colonoscopy in 5 years.   COLONOSCOPY N/A 08/05/2020   Procedure: COLONOSCOPY;  Surgeon: [name], MD;  [facility];  Service: Endoscopy;  Laterality: N/A;  8:15am   DISTAL BICEPS TENDON REPAIR Right 06/03/2015   Procedure: RIGHT DISTAL BICEPS TENDON REPAIR;  Surgeon: Elsie Saas, MD;  Location: [facility];  Service: Orthopedics;  Laterality: Right;   ESOPHAGOGASTRODUODENOSCOPY  04/2009   distal erosive reflux esophagitis, schatzki ring s/p 15F, hh, couple of antral erosions   ESOPHAGOGASTRODUODENOSCOPY  05/2011   Dr.  [name]: erosive reflux esophagitis, superimposed on Schatzki's ring and early stricture s/p 54 F, small hiatal hernia   HERNIA REPAIR     x 3   POLYPECTOMY  08/05/2020   Procedure: POLYPECTOMY;  Surgeon: [name], MD;  [facility];  Service: Endoscopy;;   ROTATOR CUFF REPAIR     right   Patient Active Problem List   Diagnosis Date Noted   History of adenomatous polyp of colon 05/01/2020   Traumatic rupture of right distal biceps tendon    PONV (postoperative nausea and vomiting)    Adenomatous colon polyp 03/17/2014   Esophageal dysphagia 05/06/2011   GERD (gastroesophageal reflux disease) 05/06/2011    PCP: Haywood Pao, MD  REFERRING PROVIDER: Kristeen Miss, MD   REFERRING DIAG: Neck pain   THERAPY DIAG:  Cervicalgia  Rationale for Evaluation and Treatment: Rehabilitation  ONSET DATE: a few months  SUBJECTIVE:  SUBJECTIVE STATEMENT: Patient reports that he feels good today and manual therapy in particular really helps.   PERTINENT HISTORY:  History of right rotator cuff repair  PAIN:  Are you having pain? Yes: NPRS scale: 0/10 Pain location: neck and right shoulder Pain description: numbness (intermittent), tight Aggravating factors: looking up and repetitive motions Relieving factors: medication  PRECAUTIONS: None  WEIGHT BEARING RESTRICTIONS: No  FALLS:  Has patient fallen in last 6 months? No  LIVING ENVIRONMENT: Lives with: lives with their family Lives in: House/apartment Has following equipment at home: None  OCCUPATION: retired  PLOF: Independent  PATIENT GOALS: reduced pain, tightness, and numbness, play golf, and play with grandchildren  NEXT MD VISIT: none scheduled  OBJECTIVE:   PATIENT SURVEYS:  FOTO 67.67 on  11/14/22  COGNITION: Overall cognitive status: Within functional limits for tasks assessed  SENSATION: Patient reports intermittent numbness in 1st and 2nd fingers of right hand  POSTURE: forward head  PALPATION: TTP: right upper trapezius, supraspinatus, infraspinatus, and scapular stabilizers with multiple trigger points throughout these muscles   CERVICAL ROM:   Active ROM A/PROM (deg) eval  Flexion 50  Extension 34; "tight"  Right lateral flexion 36  Left lateral flexion 26  Right rotation 73  Left rotation 74   (Blank rows = not tested)  UPPER EXTREMITY ROM:  Active ROM Right eval Left eval  Shoulder flexion 150 150  Shoulder extension    Shoulder abduction 120; "tight"  120  Shoulder adduction    Shoulder extension    Shoulder internal rotation    Shoulder external rotation    Elbow flexion    Elbow extension    Wrist flexion    Wrist extension    Wrist ulnar deviation    Wrist radial deviation    Wrist pronation    Wrist supination     (Blank rows = not tested)  UPPER EXTREMITY MMT:  MMT Right eval Left eval  Shoulder flexion 4+/5; "tight"  4+/5  Shoulder extension    Shoulder abduction 4/5; "tight"  4/5  Shoulder adduction    Shoulder extension    Shoulder internal rotation    Shoulder external rotation    Middle trapezius    Lower trapezius    Elbow flexion 5/5 5/5  Elbow extension 4+/5 4+/5  Wrist flexion    Wrist extension    Wrist ulnar deviation    Wrist radial deviation    Wrist pronation    Wrist supination    Grip strength 75 78   (Blank rows = not tested)  CERVICAL SPECIAL TESTS:  Spurling's test: Negative, Distraction test: Positive, and Sharp pursor's test: Negative  TODAY'S TREATMENT:                                                                                                                              DATE:  3/13 EXERCISE LOG  Exercise Repetitions and Resistance Comments  UBE  X6 minutes @ 60 RPM    Resisted pull down  Blue XTS x 3 minutes    Resisted ADD Blue XTS x 3 minutes    Right upper trapezius stretch 4 x 30 seconds        Blank cell = exercise not performed today  Manual Therapy Soft Tissue Mobilization: right upper trapezius, levator scapulae, and cervical paraspinals, for reduced pain and tone                                     3/11 EXERCISE LOG  Exercise Repetitions and Resistance Comments  UBE  X10 minutes @ 60 RPM   Resisted pull down  Blue XTS x 2.5 minutes   R UE D1 & D2  Blue XTS x 3 minutes each    Bilateral shoulder ER Green t-band x 30 reps    Horizontal ABD  Green t-band x 30 reps    Rhomboid stretch  4 x 30 seconds    Blank cell = exercise not performed today  Modalities: no redness or adverse reaction to today's modalities  Date:  Unattended Estim: right upper trapezius, pre mod @ 80-150 Hz, 15 mins, Pain and Tone Hot Pack: Shoulder, 15 mins, Pain and Tone                                   3/7 EXERCISE LOG  Exercise Repetitions and Resistance Comments  UBE X8 minutes   Horizontal ABD  Green t-band x 30 reps    Shoulder shrug  Blue t-band x 30 reps    Ressited ER Green t-band x 30 reps    Right UT stretch  4 x 30 seconds   Wall push up  20 reps     Blank cell = exercise not performed today  Manual Therapy Soft Tissue Mobilization: right upper trapezius, infraspinatus, and supraspinatus, for reduced pain and tone   Modalities  Date:  Unattended Estim: right upper trapezius and infraspinatus, IFC @ 80-150 Hz w/ 40% scan, 15 mins, Pain and Tone Hot Pack: right shoulder, 15 mins, Pain and Tone  PATIENT EDUCATION:  Education details: HEP, POC, prognosis, healing, referred pain, and goals for therapy Person educated: Patient Education method: Explanation Education comprehension: verbalized understanding  HOME EXERCISE PROGRAM: Today's interventions for provided as a HEP. He was recommended to complete these interventions  twice per day for about 2 minutes each. He reported understanding.   ASSESSMENT:  CLINICAL IMPRESSION: Patient arrived to treatment late to his appointment which limited his ability to be introduced to new interventions. Manual therapy focused on soft tissue mobilization to the right upper trapezius as this was the most effective at reducing his familiar symptoms and tone. This was followed by appropriately matched interventions to facilitate muscular strengthening and soft tissue mobility. He reported feeling good upon the conclusion of treatment. He continues to require skilled physical therapy to address his remaining impairments to return to his prior level of function.   OBJECTIVE IMPAIRMENTS: decreased activity tolerance, decreased ROM, decreased strength, impaired tone, impaired UE functional use, postural dysfunction, and pain.   ACTIVITY LIMITATIONS: lifting and reach over head  PARTICIPATION LIMITATIONS: community activity and yard work  PERSONAL FACTORS: Time since onset of injury/illness/exacerbation are also affecting patient's functional outcome.  REHAB POTENTIAL: Good  CLINICAL DECISION MAKING: Stable/uncomplicated  EVALUATION COMPLEXITY: Low   GOALS: Goals reviewed with patient? Yes  LONG TERM GOALS: Target date: 11/28/22  Patient will be independent with his HEP.  Baseline:  Goal status: INITIAL  2.  Patient will be able to complete his daily activities without his familiar pain exceeding 1/10. Baseline:  Goal status: INITIAL  3.  Patient will be able to demonstrate at least 40 degrees of cervical extension without reproducing his familiar symptoms.  Baseline:  Goal status: INITIAL  PLAN:  PT FREQUENCY: 2x/week  PT DURATION: 4 weeks  PLANNED INTERVENTIONS: Therapeutic exercises, Therapeutic activity, Neuromuscular re-education, Patient/Family education, Joint mobilization, Joint manipulation, Dry Needling, Electrical stimulation, Spinal manipulation,  Spinal mobilization, Cryotherapy, Moist heat, Vasopneumatic device, Traction, Manual therapy, and Re-evaluation  PLAN FOR NEXT SESSION: UBE, scapular stabilization, UE strengthening, manual therapy, assess for potential dry needling, and modalities as needed   Darlin Coco, PT 11/16/2022, 2:32 PM

## 2022-11-21 ENCOUNTER — Ambulatory Visit: Payer: 59

## 2022-11-21 DIAGNOSIS — M542 Cervicalgia: Secondary | ICD-10-CM

## 2022-11-21 NOTE — Therapy (Signed)
OUTPATIENT PHYSICAL THERAPY CERVICAL TREATMENT   Patient Name: Ryan Jennings MRN: [id number] DOB:01-30-1961, 62 y.o., male Today's Date: 11/21/2022  END OF SESSION:  PT End of Session - 11/21/22 0820     Visit Number 7    Number of Visits 8    Date for PT Re-Evaluation 12/30/22    PT Start Time 0816    PT Stop Time 0858    PT Time Calculation (min) 42 min    Activity Tolerance Patient tolerated treatment well    Behavior During Therapy [facility] for tasks assessed/performed              Past Medical History:  Diagnosis Date   Adenomatous colon polyp 2010   Esophageal ring    2010, 2012   GERD (gastroesophageal reflux disease)    Hyperlipidemia    Nephrolithiasis    PONV (postoperative nausea and vomiting)    PONV (postoperative nausea and vomiting)    Traumatic rupture of right distal biceps tendon    Past Surgical History:  Procedure Laterality Date   APPENDECTOMY  8/11   BICEPS TENDON REPAIR     bilateral   COLONOSCOPY  04/2009   sigmoid adenomatous polyp, next colonoscopy 04/2014   COLONOSCOPY N/A 03/24/2014   Procedure: COLONOSCOPY;  Surgeon: [name], multiple rectal and colonic polyps, abnormal cecum (query Goody powder effect) s/p biopsy.  Tubular adenomas on pathology with benign colonic mucosal biopsy with inflammation likely secondary to NSAIDs.  Recommend repeat colonoscopy in 5 years.   COLONOSCOPY N/A 08/05/2020   Procedure: COLONOSCOPY;  Surgeon: [name], MD;  [facility];  Service: Endoscopy;  Laterality: N/A;  8:15am   DISTAL BICEPS TENDON REPAIR Right 06/03/2015   Procedure: RIGHT DISTAL BICEPS TENDON REPAIR;  Surgeon: Elsie Saas, MD;  Location: [place];  Service: Orthopedics;  Laterality: Right;   ESOPHAGOGASTRODUODENOSCOPY  04/2009   distal erosive reflux esophagitis, schatzki ring s/p 35F, hh, couple of antral erosions   ESOPHAGOGASTRODUODENOSCOPY  05/2011   Dr. [name]: erosive reflux esophagitis,  superimposed on Schatzki's ring and early stricture s/p 54 F, small hiatal hernia   HERNIA REPAIR     x 3   POLYPECTOMY  08/05/2020   Procedure: POLYPECTOMY;  Surgeon: [name], MD;  [facility];  Service: Endoscopy;;   ROTATOR CUFF REPAIR     right   Patient Active Problem List   Diagnosis Date Noted   History of adenomatous polyp of colon 05/01/2020   Traumatic rupture of right distal biceps tendon    PONV (postoperative nausea and vomiting)    Adenomatous colon polyp 03/17/2014   Esophageal dysphagia 05/06/2011   GERD (gastroesophageal reflux disease) 05/06/2011    PCP: Haywood Pao, MD  REFERRING PROVIDER: Kristeen Miss, MD   REFERRING DIAG: Neck pain   THERAPY DIAG:  Cervicalgia  Rationale for Evaluation and Treatment: Rehabilitation  ONSET DATE: a few months  SUBJECTIVE:  SUBJECTIVE STATEMENT: Patient reports that his neck was a little tight yesterday after driving back from the beach, but it feels better today.   PERTINENT HISTORY:  History of right rotator cuff repair  PAIN:  Are you having pain? Yes: NPRS scale: 0/10 Pain location: neck and right shoulder Pain description: numbness (intermittent), tight Aggravating factors: looking up and repetitive motions Relieving factors: medication  PRECAUTIONS: None  WEIGHT BEARING RESTRICTIONS: No  FALLS:  Has patient fallen in last 6 months? No  LIVING ENVIRONMENT: Lives with: lives with their family Lives in: House/apartment Has following equipment at home: None  OCCUPATION: retired  PLOF: Independent  PATIENT GOALS: reduced pain, tightness, and numbness, play golf, and play with grandchildren  NEXT MD VISIT: none scheduled  OBJECTIVE:   PATIENT SURVEYS:  FOTO 67.67 on  11/14/22  COGNITION: Overall cognitive status: Within functional limits for tasks assessed  SENSATION: Patient reports intermittent numbness in 1st and 2nd fingers of right hand  POSTURE: forward head  PALPATION: TTP: right upper trapezius, supraspinatus, infraspinatus, and scapular stabilizers with multiple trigger points throughout these muscles   CERVICAL ROM:   Active ROM A/PROM (deg) eval  Flexion 50  Extension 34; "tight"  Right lateral flexion 36  Left lateral flexion 26  Right rotation 73  Left rotation 74   (Blank rows = not tested)  UPPER EXTREMITY ROM:  Active ROM Right eval Left eval  Shoulder flexion 150 150  Shoulder extension    Shoulder abduction 120; "tight"  120  Shoulder adduction    Shoulder extension    Shoulder internal rotation    Shoulder external rotation    Elbow flexion    Elbow extension    Wrist flexion    Wrist extension    Wrist ulnar deviation    Wrist radial deviation    Wrist pronation    Wrist supination     (Blank rows = not tested)  UPPER EXTREMITY MMT:  MMT Right eval Left eval  Shoulder flexion 4+/5; "tight"  4+/5  Shoulder extension    Shoulder abduction 4/5; "tight"  4/5  Shoulder adduction    Shoulder extension    Shoulder internal rotation    Shoulder external rotation    Middle trapezius    Lower trapezius    Elbow flexion 5/5 5/5  Elbow extension 4+/5 4+/5  Wrist flexion    Wrist extension    Wrist ulnar deviation    Wrist radial deviation    Wrist pronation    Wrist supination    Grip strength 75 78   (Blank rows = not tested)  CERVICAL SPECIAL TESTS:  Spurling's test: Negative, Distraction test: Positive, and Sharp pursor's test: Negative  TODAY'S TREATMENT:                                                                                                                              DATE:  3/18 EXERCISE LOG  Exercise Repetitions and Resistance Comments  UBE  X10 minutes @ 60 RPM    Shoulder flexion w/ resistance Green t-band x 2 minutes  With resistance around his wrist  Bilateral ER  Green t-band x 2.5 minutes   X's  Green t-band x 15 reps each    Cervical SNAG for extension     Resisted pull down  Green t-band x 3 minutes   Resisted scaption  Green t-band x 2 minutes    Blank cell = exercise not performed today                                    3/13 EXERCISE LOG  Exercise Repetitions and Resistance Comments  UBE X6 minutes @ 60 RPM    Resisted pull down  Blue XTS x 3 minutes    Resisted ADD Blue XTS x 3 minutes    Right upper trapezius stretch 4 x 30 seconds        Blank cell = exercise not performed today  Manual Therapy Soft Tissue Mobilization: right upper trapezius, levator scapulae, and cervical paraspinals, for reduced pain and tone                                     3/11 EXERCISE LOG  Exercise Repetitions and Resistance Comments  UBE  X10 minutes @ 60 RPM   Resisted pull down  Blue XTS x 2.5 minutes   R UE D1 & D2  Blue XTS x 3 minutes each    Bilateral shoulder ER Green t-band x 30 reps    Horizontal ABD  Green t-band x 30 reps    Rhomboid stretch  4 x 30 seconds    Blank cell = exercise not performed today  Modalities: no redness or adverse reaction to today's modalities  Date:  Unattended Estim: right upper trapezius, pre mod @ 80-150 Hz, 15 mins, Pain and Tone Hot Pack: Shoulder, 15 mins, Pain and Tone   PATIENT EDUCATION:  Education details: HEP, POC, prognosis, healing, referred pain, and goals for therapy Person educated: Patient Education method: Explanation Education comprehension: verbalized understanding  HOME EXERCISE PROGRAM: [id number]  ASSESSMENT:  CLINICAL IMPRESSION: Patient was progressed with resisted scaption for improved scapular and rotator cuff strengthening for improved function with his daily activities. He required minimal cueing with resisted shoulder flexion with resistance to  maintain tension on the theraband to facilitate scapular stability. He experienced no pain or discomfort with any of today's interventions with fatigue being his primary limitation. He reported feeling good upon the conclusion of treatment. He continues to require skilled physical therapy to address his remaining impairments to return to his prior level of function.   OBJECTIVE IMPAIRMENTS: decreased activity tolerance, decreased ROM, decreased strength, impaired tone, impaired UE functional use, postural dysfunction, and pain.   ACTIVITY LIMITATIONS: lifting and reach over head  PARTICIPATION LIMITATIONS: community activity and yard work  PERSONAL FACTORS: Time since onset of injury/illness/exacerbation are also affecting patient's functional outcome.   REHAB POTENTIAL: Good  CLINICAL DECISION MAKING: Stable/uncomplicated  EVALUATION COMPLEXITY: Low   GOALS: Goals reviewed with patient? Yes  LONG TERM GOALS: Target date: 11/28/22  Patient will be independent with his HEP.  Baseline:  Goal status: INITIAL  2.  Patient will be able to complete his daily activities without his  familiar pain exceeding 1/10. Baseline:  Goal status: INITIAL  3.  Patient will be able to demonstrate at least 40 degrees of cervical extension without reproducing his familiar symptoms.  Baseline:  Goal status: INITIAL  PLAN:  PT FREQUENCY: 2x/week  PT DURATION: 4 weeks  PLANNED INTERVENTIONS: Therapeutic exercises, Therapeutic activity, Neuromuscular re-education, Patient/Family education, Joint mobilization, Joint manipulation, Dry Needling, Electrical stimulation, Spinal manipulation, Spinal mobilization, Cryotherapy, Moist heat, Vasopneumatic device, Traction, Manual therapy, and Re-evaluation  PLAN FOR NEXT SESSION: assess for potential discharge    Darlin Coco, PT 11/21/2022, 9:11 AM

## 2022-11-28 ENCOUNTER — Ambulatory Visit: Payer: 59

## 2022-11-28 DIAGNOSIS — M542 Cervicalgia: Secondary | ICD-10-CM

## 2022-11-28 NOTE — Therapy (Signed)
OUTPATIENT PHYSICAL THERAPY CERVICAL TREATMENT   Patient Name: Ryan Jennings MRN: [id number] DOB:[birth year], 62 y.o., male Today's Date: 11/28/2022  END OF SESSION:  PT End of Session - 11/28/22 0819     Visit Number 8    Number of Visits 8    Date for PT Re-Evaluation 12/30/22    PT Start Time 0819    PT Stop Time 0850    PT Time Calculation (min) 31 min    Activity Tolerance Patient tolerated treatment well    Behavior During Therapy [facility] for tasks assessed/performed               Past Medical History:  Diagnosis Date   Adenomatous colon polyp 2010   Esophageal ring    2010, 2012   GERD (gastroesophageal reflux disease)    Hyperlipidemia    Nephrolithiasis    PONV (postoperative nausea and vomiting)    PONV (postoperative nausea and vomiting)    Traumatic rupture of right distal biceps tendon    Past Surgical History:  Procedure Laterality Date   APPENDECTOMY  8/11   BICEPS TENDON REPAIR     bilateral   COLONOSCOPY  04/2009   sigmoid adenomatous polyp, next colonoscopy 04/2014   COLONOSCOPY N/A 03/24/2014   Procedure: COLONOSCOPY;  Surgeon: [name], multiple rectal and colonic polyps, abnormal cecum (query Goody powder effect) s/p biopsy.  Tubular adenomas on pathology with benign colonic mucosal biopsy with inflammation likely secondary to NSAIDs.  Recommend repeat colonoscopy in 5 years.   COLONOSCOPY N/A 08/05/2020   Procedure: COLONOSCOPY;  Surgeon: [name], MD;  [facility];  Service: Endoscopy;  Laterality: N/A;  8:15am   DISTAL BICEPS TENDON REPAIR Right 06/03/2015   Procedure: RIGHT DISTAL BICEPS TENDON REPAIR;  Surgeon: Elsie Saas, MD;  Location: [place];  Service: Orthopedics;  Laterality: Right;   ESOPHAGOGASTRODUODENOSCOPY  04/2009   distal erosive reflux esophagitis, schatzki ring s/p 48F, hh, couple of antral erosions   ESOPHAGOGASTRODUODENOSCOPY  05/2011   Dr. [name]: erosive reflux esophagitis,  superimposed on Schatzki's ring and early stricture s/p 54 F, small hiatal hernia   HERNIA REPAIR     x 3   POLYPECTOMY  08/05/2020   Procedure: POLYPECTOMY;  Surgeon: [name], MD;  [facility];  Service: Endoscopy;;   ROTATOR CUFF REPAIR     right   Patient Active Problem List   Diagnosis Date Noted   History of adenomatous polyp of colon 05/01/2020   Traumatic rupture of right distal biceps tendon    PONV (postoperative nausea and vomiting)    Adenomatous colon polyp 03/17/2014   Esophageal dysphagia 05/06/2011   GERD (gastroesophageal reflux disease) 05/06/2011    PCP: Haywood Pao, MD  REFERRING PROVIDER: Kristeen Miss, MD   REFERRING DIAG: Neck pain   THERAPY DIAG:  Cervicalgia  Rationale for Evaluation and Treatment: Rehabilitation  ONSET DATE: a few months  SUBJECTIVE:  SUBJECTIVE STATEMENT: Patient reports that he had some tightness in his neck from sleeping in his recliner over the weekend. However, it is better. He can still tell that he has some tightness, but no numbness and the tightness is getting better.   PERTINENT HISTORY:  History of right rotator cuff repair  PAIN:  Are you having pain? Yes: NPRS scale: 0/10 Pain location: neck and right shoulder Pain description: numbness (intermittent), tight Aggravating factors: looking up and repetitive motions Relieving factors: medication  PRECAUTIONS: None  WEIGHT BEARING RESTRICTIONS: No  FALLS:  Has patient fallen in last 6 months? No  LIVING ENVIRONMENT: Lives with: lives with their family Lives in: House/apartment Has following equipment at home: None  OCCUPATION: retired  PLOF: Independent  PATIENT GOALS: reduced pain, tightness, and numbness, play golf, and play with  grandchildren  NEXT MD VISIT: none scheduled  OBJECTIVE:   PATIENT SURVEYS:  FOTO 96.36 on 11/28/22  COGNITION: Overall cognitive status: Within functional limits for tasks assessed  SENSATION: Patient reports intermittent numbness in 1st and 2nd fingers of right hand  POSTURE: forward head  PALPATION: TTP: right upper trapezius, supraspinatus, infraspinatus, and scapular stabilizers with multiple trigger points throughout these muscles   CERVICAL ROM:   Active ROM A/PROM (deg) eval 11/28/22   Flexion 50 52  Extension 34; "tight" 52  Right lateral flexion 36 42  Left lateral flexion 26 40  Right rotation 73 78; tight   Left rotation 74 72   (Blank rows = not tested)  UPPER EXTREMITY ROM:  Active ROM Right eval Left eval  Shoulder flexion 150 150  Shoulder extension    Shoulder abduction 120; "tight"  120  Shoulder adduction    Shoulder extension    Shoulder internal rotation    Shoulder external rotation    Elbow flexion    Elbow extension    Wrist flexion    Wrist extension    Wrist ulnar deviation    Wrist radial deviation    Wrist pronation    Wrist supination     (Blank rows = not tested)  UPPER EXTREMITY MMT:  MMT Right eval Left eval  Shoulder flexion 4+/5; "tight"  4+/5  Shoulder extension    Shoulder abduction 4/5; "tight"  4/5  Shoulder adduction    Shoulder extension    Shoulder internal rotation    Shoulder external rotation    Middle trapezius    Lower trapezius    Elbow flexion 5/5 5/5  Elbow extension 4+/5 4+/5  Wrist flexion    Wrist extension    Wrist ulnar deviation    Wrist radial deviation    Wrist pronation    Wrist supination    Grip strength 75 78   (Blank rows = not tested)  CERVICAL SPECIAL TESTS:  Spurling's test: Negative, Distraction test: Positive, and Sharp pursor's test: Negative  TODAY'S TREATMENT:  DATE:                                     3/25 EXERCISE LOG Manual Therapy Soft Tissue Mobilization: right upper trapezius and levator scapulae, for reduced tone                                     3/18 EXERCISE LOG  Exercise Repetitions and Resistance Comments  UBE X10 minutes @ 60 RPM    Shoulder flexion w/ resistance Green t-band x 2 minutes  With resistance around his wrist  Bilateral ER  Green t-band x 2.5 minutes   X's  Green t-band x 15 reps each    Cervical SNAG for extension     Resisted pull down  Green t-band x 3 minutes   Resisted scaption  Green t-band x 2 minutes    Blank cell = exercise not performed today                                    3/13 EXERCISE LOG  Exercise Repetitions and Resistance Comments  UBE X6 minutes @ 60 RPM    Resisted pull down  Blue XTS x 3 minutes    Resisted ADD Blue XTS x 3 minutes    Right upper trapezius stretch 4 x 30 seconds        Blank cell = exercise not performed today  Manual Therapy Soft Tissue Mobilization: right upper trapezius, levator scapulae, and cervical paraspinals, for reduced pain and tone    PATIENT EDUCATION:  Education details: HEP, goals for therapy, returning to the gym safely, expectation for soreness, and progress with therapy Person educated: Patient Education method: Explanation Education comprehension: verbalized understanding  HOME EXERCISE PROGRAM: [id number]  ASSESSMENT:  CLINICAL IMPRESSION: Patient has made excellent progress with skilled physical therapy as evidenced by his subjective reports, objective measures, functional mobility, and progress toward his goals. He was able to meet all of his goals for therapy at this time and felt comfortable being discharged at this time. Manual therapy focused on soft tissue mobilization to his right upper trapezius and levator scapulae for reduced tone. He was educated on safely returning to the gym to continue to facilitate improved strength. His  HEP was reviewed and he was able to recall these interventions. He felt comfortable being discharged with his HEP at this time.  PHYSICAL THERAPY DISCHARGE SUMMARY  Visits from Start of Care: 8  Current functional level related to goals / functional outcomes: Patient was able to meet all of his goals for therapy.    Remaining deficits: None    Education / Equipment: HEP    Patient agrees to discharge. Patient goals were met. Patient is being discharged due to meeting the stated rehab goals.   OBJECTIVE IMPAIRMENTS: decreased activity tolerance, decreased ROM, decreased strength, impaired tone, impaired UE functional use, postural dysfunction, and pain.   ACTIVITY LIMITATIONS: lifting and reach over head  PARTICIPATION LIMITATIONS: community activity and yard work  PERSONAL FACTORS: Time since onset of injury/illness/exacerbation are also affecting patient's functional outcome.   REHAB POTENTIAL: Good  CLINICAL DECISION MAKING: Stable/uncomplicated  EVALUATION COMPLEXITY: Low   GOALS: Goals reviewed with patient? Yes  LONG TERM GOALS: Target date: 11/28/22  Patient will be  independent with his HEP.  Baseline:  Goal status: MET  2.  Patient will be able to complete his daily activities without his familiar pain exceeding 1/10. Baseline:  Goal status: MET  3.  Patient will be able to demonstrate at least 40 degrees of cervical extension without reproducing his familiar symptoms.  Baseline:  Goal status: MET  PLAN:  PT FREQUENCY: 2x/week  PT DURATION: 4 weeks  PLANNED INTERVENTIONS: Therapeutic exercises, Therapeutic activity, Neuromuscular re-education, Patient/Family education, Joint mobilization, Joint manipulation, Dry Needling, Electrical stimulation, Spinal manipulation, Spinal mobilization, Cryotherapy, Moist heat, Vasopneumatic device, Traction, Manual therapy, and Re-evaluation  PLAN FOR NEXT SESSION: assess for potential discharge    Darlin Coco,  PT 11/28/2022, 9:01 AM

## 2022-12-13 ENCOUNTER — Encounter: Payer: Self-pay | Admitting: Internal Medicine

## 2022-12-13 ENCOUNTER — Ambulatory Visit: Payer: 59 | Admitting: Internal Medicine

## 2022-12-13 VITALS — BP 142/76 | HR 56 | Temp 97.9°F | Ht 68.0 in | Wt 157.2 lb

## 2022-12-13 DIAGNOSIS — K219 Gastro-esophageal reflux disease without esophagitis: Secondary | ICD-10-CM

## 2022-12-13 DIAGNOSIS — R0982 Postnasal drip: Secondary | ICD-10-CM

## 2022-12-13 DIAGNOSIS — R053 Chronic cough: Secondary | ICD-10-CM

## 2022-12-13 MED ORDER — CETIRIZINE HCL 10 MG PO TABS: 10.0000 mg | ORAL_TABLET | Freq: Every day | ORAL | 5 refills | Status: AC

## 2022-12-13 NOTE — Progress Notes (Signed)
MARKS [name]    [id number]    03-03-1961  Primary Care Physician:Tisovec, Adelfa Koh, MD  Referring Physician: Rebekah Chesterfield, NP [address] [place],  [place] [address] Reason for Consultation: chronic cough Date of Consultation: 12/13/2022  Chief complaint:   Chief Complaint  Patient presents with   Consult    Persistent cough, pt states his cough has gotten better. Pt has had this cough since December. He has tried antibiotics, inhalers and cough syrup. Pt has not seen a difference with using inhalers      HPI: Ryan Jennings is a 62 y.o. man who presents for new patient evaluation of cough.   Symptoms started in December. Was diagnosed with strep throat and given antibiotics and steroids at urgent care. No improvement. He went back and was given breztri and cough syrup without improvement. Went back in March and was then referred to pulmonary medicine. The cough has overall improved. He feels the cough in his upper/middle chest.   Dry cough mostly, Cough worse when he lays flat at night. Waking him up at night. Occasional yellow phelgm. No fevers, chills, no sick contacts.   Has some mild sinus congestion with post nasal drainage.   He says it used to be much worse and he was on dexilant for many years. Has a hiatal hernia. He does have a history of reflux and had been on off his dexilant for many months. He has been back on it for about 6-7 weeks.   Takes cough drops to help suppress during the day. Feels like there is a tickle in his throat. No relation to food. Denies shortness of breath or wheezing.   Does have seasonal allergies.   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 [place], has a Development worker, international aid.  Smoking history: never smoked  Social History   Occupational History    Employer: [employer]  Tobacco Use   Smoking status:  Never   Smokeless tobacco: Never  Substance and Sexual Activity   Alcohol use: No   Drug use: No   Sexual activity: Not on file    Relevant family history:  Family History  Problem Relation Age of Onset   Lymphoma Mother 66       deceased   Colon polyps Father        before age of 79   Cancer - Lung Brother    Pancreatic cancer Maternal Grandfather    Stroke Paternal Grandfather 67   Colon cancer Neg Hx     Past Medical History:  Diagnosis Date   Adenomatous colon polyp 2010   Esophageal ring    2010, 2012   GERD (gastroesophageal reflux disease)    Hyperlipidemia    Nephrolithiasis    PONV (postoperative nausea and vomiting)    PONV (postoperative nausea and vomiting)    Traumatic rupture of right distal biceps tendon     Past Surgical History:  Procedure Laterality Date   APPENDECTOMY  8/11   BICEPS TENDON REPAIR     bilateral   COLONOSCOPY  04/2009   sigmoid adenomatous polyp, next colonoscopy 04/2014   COLONOSCOPY N/A 03/24/2014   Procedure: COLONOSCOPY;  Surgeon: Gerrit Friends Rourk, multiple rectal and colonic polyps, abnormal cecum (query Goody powder effect) s/p biopsy.  Tubular adenomas on pathology with benign colonic mucosal biopsy  with inflammation likely secondary to NSAIDs.  Recommend repeat colonoscopy in 5 years.   COLONOSCOPY N/A 08/05/2020   Procedure: COLONOSCOPY;  Surgeon: Corbin Ade, MD;  [facility];  Service: Endoscopy;  Laterality: N/A;  8:15am   DISTAL BICEPS TENDON REPAIR Right 06/03/2015   Procedure: RIGHT DISTAL BICEPS TENDON REPAIR;  Surgeon: Salvatore Marvel, MD;  Location: [facility];  Service: Orthopedics;  Laterality: Right;   ESOPHAGOGASTRODUODENOSCOPY  04/2009   distal erosive reflux esophagitis, schatzki ring s/p 76F, hh, couple of antral erosions   ESOPHAGOGASTRODUODENOSCOPY  05/2011   Dr. Jena Gauss: erosive reflux esophagitis, superimposed on Schatzki's ring and early stricture s/p 54 F, small hiatal hernia    HERNIA REPAIR     x 3   POLYPECTOMY  08/05/2020   Procedure: POLYPECTOMY;  Surgeon: Corbin Ade, MD;  [facility];  Service: Endoscopy;;   ROTATOR CUFF REPAIR     right     Physical Exam: Blood pressure (!) 142/76, pulse (!) 56, temperature 97.9 F (36.6 C), temperature source Oral, height 5\' 8"  (1.727 m), weight 157 lb 3.2 oz (71.3 kg), SpO2 100 %. Gen:      No acute distress, frequent throat clearing ENT:  mild nasal debris, no nasal polyps, mucus membranes moist Lungs:    No increased respiratory effort, symmetric chest wall excursion, clear to auscultation bilaterally, no wheezes or crackles CV:         Regular rate and rhythm; no murmurs, rubs, or gallops.  No pedal edema Abd:      + bowel sounds; soft, non-tender; no distension MSK: no acute synovitis of DIP or PIP joints, no mechanics hands.  Skin:      Warm and dry; no rashes Neuro: normal speech, no focal facial asymmetry Psych: alert and oriented x3, normal mood and affect   Data Reviewed/Medical Decision Making:  Independent interpretation of tests: Imaging:  Review of patient's CT cardiac scoring images in 2016 revealed no lung process in visualized windows. The patient's images have been independently reviewed by me.    PFTs:  Labs:    Immunization status:  Immunization History  Administered Date(s) Administered   Influenza,inj,Quad PF,6+ Mos 07/05/2016     I reviewed prior external note(s) from GI, derm  I reviewed the result(s) of the labs and imaging as noted above.   I have ordered   Assessment:  Chronic cough, likely GERD Post nasal drainage   Plan/Recommendations:  I think your cough is a combination of reflux primarily with component of post nasal drainage from allergies. Continue the dexilant. Start taking an allergy pill like claritin, zyrtec.  Monitor your diet to see what food and drink triggers reflux.  If symptoms are still bothersome at night you can try sleeping with a  wedge pillow.   We discussed disease management and progression at length today.    Return to Care: Return in about 3 months (around 03/14/2023).  Durel Salts, MD Pulmonary and Critical Care Medicine [facility] Office:[phone]  CC: Rebekah Chesterfield, NP

## 2022-12-13 NOTE — Patient Instructions (Addendum)
I think your cough is a combination of reflux primarily with component of post nasal drainage from allergies. Continue the dexilant. Start taking an allergy pill like claritin, zyrtec.  Monitor your diet to see what food and drink triggers reflux.  If symptoms are still bothersome at night you can try sleeping with a wedge pillow.    What is GERD? Gastroesophageal reflux disease (GERD) is gastroesophageal reflux diseasewhich occurs when the lower esophageal sphincter (LES) opens spontaneously, for varying periods of time, or does not close properly and stomach contents rise up into the esophagus. GER is also called acid reflux or acid regurgitation, because digestive juices--called acids--rise up with the food. The esophagus is the tube that carries food from the mouth to the stomach. The LES is a ring of muscle at the bottom of the esophagus that acts like a valve between the esophagus and stomach.  When acid reflux occurs, food or fluid can be tasted in the back of the mouth. When refluxed stomach acid touches the lining of the esophagus it may cause a burning sensation in the chest or throat called heartburn or acid indigestion. Occasional reflux is common. Persistent reflux that occurs more than twice a week is considered GERD, and it can eventually lead to more serious health problems. People of all ages can have GERD. Studies have shown that GERD may worsen or contribute to asthma, chronic cough, and pulmonary fibrosis.   What are the symptoms of GERD? The main symptom of GERD in adults is frequent heartburn, also called acid indigestion--burning-type pain in the lower part of the mid-chest, behind the breast bone, and in the mid-abdomen.  Not all reflux is acidic in nature, and many patients don't have heart burn at all. Sometimes it feels like a cough (either dry or with mucus), choking sensation, asthma, shortness of breath, waking up at night, frequent throat clearing, or trouble swallowing.     What causes GERD? The reason some people develop GERD is still unclear. However, research shows that in people with GERD, the LES relaxes while the rest of the esophagus is working. Anatomical abnormalities such as a hiatal hernia may also contribute to GERD. A hiatal hernia occurs when the upper part of the stomach and the LES move above the diaphragm, the muscle wall that separates the stomach from the chest. Normally, the diaphragm helps the LES keep acid from rising up into the esophagus. When a hiatal hernia is present, acid reflux can occur more easily. A hiatal hernia can occur in people of any age and is most often a normal finding in otherwise healthy people over age 80. Most of the time, a hiatal hernia produces no symptoms.   Other factors that may contribute to GERD include - Obesity or recent weight gain - Pregnancy  - Smoking  - Diet - Certain medications  Common foods that can worsen reflux symptoms include: - carbonated beverages - artificial sweeteners - citrus fruits  - chocolate  - drinks with caffeine or alcohol  - fatty and fried foods  - garlic and onions  - mint flavorings  - spicy foods  - tomato-based foods, like spaghetti sauce, salsa, chili, and pizza   Lifestyle Changes If you smoke, stop.  Avoid foods and beverages that worsen symptoms (see above.) Lose weight if needed.  Eat small, frequent meals.  Wear loose-fitting clothes.  Avoid lying down for 3 hours after a meal.  Raise the head of your bed 6 to 8 inches by securing  wood blocks under the bedposts. Just using extra pillows will not help, but using a wedge-shaped pillow may be helpful.  Medications  H2 blockers, such as cimetidine (Tagamet HB), famotidine (Pepcid AC), nizatidine (Axid AR), and ranitidine (Zantac 75), decrease acid production. They are available in prescription strength and over-the-counter strength. These drugs provide short-term relief and are effective for about half of those  who have GERD symptoms.  Proton pump inhibitors include omeprazole (Prilosec, Zegerid), lansoprazole (Prevacid), pantoprazole (Protonix), rabeprazole (Aciphex), and esomeprazole (Nexium), which are available by prescription. Prilosec is also available in over-the-counter strength. Proton pump inhibitors are more effective than H2 blockers and can relieve symptoms and heal the esophageal lining in almost everyone who has GERD.  Because drugs work in different ways, combinations of medications may help control symptoms. People who get heartburn after eating may take both antacids and H2 blockers. The antacids work first to neutralize the acid in the stomach, and then the H2 blockers act on acid production. By the time the antacid stops working, the H2 blocker will have stopped acid production. Your health care provider is the best source of information about how to use medications for GERD.   Points to Remember 1. You can have GERD without having heartburn. Your symptoms could include a dry cough, asthma symptoms, or trouble swallowing.  2. Taking medications daily as prescribed is important in controlling you symptoms.  Sometimes it can take up to 8 weeks to fully achieve the effects of the medications prescribed.  3. Coughing related to GERD can be difficult to treat and is very frustrating!  However, it is important to stick with these medications and lifestyle modifications before pursuing more aggressive or invasive test and treatments.

## 2023-07-06 ENCOUNTER — Encounter: Payer: Self-pay | Admitting: Urology

## 2023-07-06 ENCOUNTER — Telehealth: Payer: Self-pay

## 2023-07-06 ENCOUNTER — Ambulatory Visit: Payer: 59 | Admitting: Urology

## 2023-07-06 VITALS — BP 181/78 | HR 55 | Ht 68.0 in | Wt 157.2 lb

## 2023-07-06 DIAGNOSIS — N5201 Erectile dysfunction due to arterial insufficiency: Secondary | ICD-10-CM

## 2023-07-06 DIAGNOSIS — I861 Scrotal varices: Secondary | ICD-10-CM

## 2023-07-06 DIAGNOSIS — Z125 Encounter for screening for malignant neoplasm of prostate: Secondary | ICD-10-CM

## 2023-07-06 DIAGNOSIS — N50811 Right testicular pain: Secondary | ICD-10-CM | POA: Diagnosis not present

## 2023-07-06 LAB — URINALYSIS, ROUTINE W REFLEX MICROSCOPIC
Bilirubin, UA: NEGATIVE
Glucose, UA: NEGATIVE
Ketones, UA: NEGATIVE
Leukocytes,UA: NEGATIVE
Nitrite, UA: NEGATIVE
Protein,UA: NEGATIVE
RBC, UA: NEGATIVE
Specific Gravity, UA: 1.02 (ref 1.005–1.030)
Urobilinogen, Ur: 1 mg/dL (ref 0.2–1.0)
pH, UA: 7.5 (ref 5.0–7.5)

## 2023-07-06 MED ORDER — SILDENAFIL CITRATE 100 MG PO TABS: 100.0000 mg | ORAL_TABLET | Freq: Every day | ORAL | 11 refills | Status: AC | PRN

## 2023-07-06 NOTE — Telephone Encounter (Signed)
Left a voicemail for [facility] at [phone] requesting a call back in regards to medical records.

## 2023-07-06 NOTE — Telephone Encounter (Signed)
-----   Message from Bjorn Pippin sent at 07/06/2023  9:08 AM EDT ----- I need to see if Dr. Wylene Simmer has done a PSA in the past couple of years.  He is with [facility] in [place].

## 2023-07-06 NOTE — Progress Notes (Signed)
Subjective: 1. Screening for prostate cancer   2. Pain in right testicle   3. Varicocele   4. Erectile dysfunction due to arterial insufficiency      Consult requested by Guerry Bruin MD  Ryan Jennings is a former patient of mine from [place] who I last saw in 2019 for right testicular pain and varicocele.  He was managed with PT.  He had a low PSA at that time but slight grittiness on a prostate exam. He has a history of Peyronie's and that remains mild.  He is not sure he has had a recent PSA. His UA is clear.  He has some occasional intermittent right testicular pain.  His IPSS is 4 with some frequency and nocturia x 0-1. He has some issues with ED.  He had some benefit from Viagra.   07/12/23: PSA 03/21/2022 0.233 and 07/04/23 0.3.    IPSS     Row Name 07/06/23 0900         International Prostate Symptom Score   How often have you had the sensation of not emptying your bladder? Less than 1 in 5     How often have you had to urinate less than every two hours? Less than half the time     How often have you found you stopped and started again several times when you urinated? Not at All     How often have you found it difficult to postpone urination? Not at All     How often have you had a weak urinary stream? Not at All     How often have you had to strain to start urination? Not at All     How many times did you typically get up at night to urinate? 1 Time     Total IPSS Score 4       Quality of Life due to urinary symptoms   If you were to spend the rest of your life with your urinary condition just the way it is now how would you feel about that? Pleased             ROS:  Review of Systems  All other systems reviewed and are negative.   Allergies  Allergen Reactions   Penicillins Rash    Reaction: Childhood    Past Medical History:  Diagnosis Date   Adenomatous colon polyp 2010   Esophageal ring    2010, 2012   GERD (gastroesophageal reflux disease)     Hyperlipidemia    Nephrolithiasis    PONV (postoperative nausea and vomiting)    PONV (postoperative nausea and vomiting)    Traumatic rupture of right distal biceps tendon     Past Surgical History:  Procedure Laterality Date   APPENDECTOMY  8/11   BICEPS TENDON REPAIR     bilateral   COLONOSCOPY  04/2009   sigmoid adenomatous polyp, next colonoscopy 04/2014   COLONOSCOPY N/A 03/24/2014   Procedure: COLONOSCOPY;  Surgeon: Corbin Ade, multiple rectal and colonic polyps, abnormal cecum (query Goody powder effect) s/p biopsy.  Tubular adenomas on pathology with benign colonic mucosal biopsy with inflammation likely secondary to NSAIDs.  Recommend repeat colonoscopy in 5 years.   COLONOSCOPY N/A 08/05/2020   Procedure: COLONOSCOPY;  Surgeon: Corbin Ade, MD;  [facility];  Service: Endoscopy;  Laterality: N/A;  8:15am   DISTAL BICEPS TENDON REPAIR Right 06/03/2015   Procedure: RIGHT DISTAL BICEPS TENDON REPAIR;  Surgeon: Salvatore Marvel, MD;  Location: [facility];  Service: Orthopedics;  Laterality: Right;   ESOPHAGOGASTRODUODENOSCOPY  04/2009   distal erosive reflux esophagitis, schatzki ring s/p 23F, hh, couple of antral erosions   ESOPHAGOGASTRODUODENOSCOPY  05/2011   Dr. Jena Gauss: erosive reflux esophagitis, superimposed on Schatzki's ring and early stricture s/p 54 F, small hiatal hernia   HERNIA REPAIR     x 3   POLYPECTOMY  08/05/2020   Procedure: POLYPECTOMY;  Surgeon: Corbin Ade, MD;  [facility];  Service: Endoscopy;;   ROTATOR CUFF REPAIR     right    Social History   Socioeconomic History   Marital status: Married    Spouse name: Not on file   Number of children: 3   Years of education: Not on file   Highest education level: Not on file  Occupational History    Employer: [employer]  Tobacco Use   Smoking status: Never   Smokeless tobacco: Never  Substance and Sexual Activity   Alcohol use: No   Drug use: No    Sexual activity: Not on file  Other Topics Concern   Not on file  Social History Narrative   Lives at home with wife and daughter.     Social Determinants of Health   Financial Resource Strain: Not on file  Food Insecurity: Not on file  Transportation Needs: Not on file  Physical Activity: Not on file  Stress: Not on file  Social Connections: Not on file  Intimate Partner Violence: Not on file    Family History  Problem Relation Age of Onset   Lymphoma Mother 71       deceased   Colon polyps Father        before age of 88   Cancer - Lung Brother    Pancreatic cancer Maternal Grandfather    Stroke Paternal Grandfather 27   Colon cancer Neg Hx     Anti-infectives: Anti-infectives (From admission, onward)    None       Current Outpatient Medications  Medication Sig Dispense Refill   ascorbic acid (VITAMIN C) 500 MG tablet Take 500 mg by mouth 3 (three) times a week.     cetirizine (ZYRTEC) 10 MG tablet Take 1 tablet (10 mg total) by mouth daily. 30 tablet 5   DEXILANT 60 MG capsule TAKE ONE (1) CAPSULE EACH DAY (Patient taking differently: Take 60 mg by mouth at bedtime.) 90 capsule 3   sildenafil (VIAGRA) 100 MG tablet Take 1 tablet (100 mg total) by mouth daily as needed for erectile dysfunction. 10 tablet 11   No current facility-administered medications for this visit.     Objective: Vital signs in last 24 hours: BP (!) 181/78   Pulse (!) 55   Ht 5\' 8"  (1.727 m)   Wt 157 lb 3.2 oz (71.3 kg)   BMI 23.90 kg/m   Intake/Output from previous day: No intake/output data recorded. Intake/Output this shift: @IOTHISSHIFT @   Physical Exam Vitals reviewed.  Constitutional:      Appearance: Normal appearance.  Abdominal:     General: Abdomen is flat.     Palpations: Abdomen is soft.     Hernia: No hernia is present.  Genitourinary:    Comments: Nl phallus with adequate meatus. Scrotum has a right G2 varicocele. Testes and epididymis are normal. AP NST  without mass. Prostate 1.5+ benign. SV non-palpable.  Musculoskeletal:        General: Normal range of motion.  Skin:    General: Skin is  warm and dry.  Neurological:     General: No focal deficit present.     Mental Status: He is alert and oriented to person, place, and time.     Lab Results:  Results for orders placed or performed in visit on 07/06/23 (from the past 24 hour(s))  Urinalysis, Routine w reflex microscopic     Status: None   Collection Time: 07/06/23  9:19 AM  Result Value Ref Range   Specific Gravity, UA 1.020 1.005 - 1.030   pH, UA 7.5 5.0 - 7.5   Color, UA Yellow Yellow   Appearance Ur Clear Clear   Leukocytes,UA Negative Negative   Protein,UA Negative Negative/Trace   Glucose, UA Negative Negative   Ketones, UA Negative Negative   RBC, UA Negative Negative   Bilirubin, UA Negative Negative   Urobilinogen, Ur 1.0 0.2 - 1.0 mg/dL   Nitrite, UA Negative Negative   Microscopic Examination Comment    Narrative   Performed at:  [id number] [facility] [address] [place], [place]  [id number] Lab Director: Chinita Pester MT, Phone:  [phone]    BMET No results for input(s): "NA", "K", "CL", "CO2", "GLUCOSE", "BUN", "CREATININE", "CALCIUM" in the last 72 hours. PT/INR No results for input(s): "LABPROT", "INR" in the last 72 hours. ABG No results for input(s): "PHART", "HCO3" in the last 72 hours.  Invalid input(s): "PCO2", "PO2" UA is clear.  Studies/Results: No results found. Records requested from Dr.Tisovec.   Assessment/Plan: Screening for prostate cancer:  His exam is benign.  He just had labs with Dr. Wylene Simmer so I will request PSA results.  ED: I have sent Sildenafil which he has taken in the past.  Right testicular pain and varicocele: This is mild and intermittent and requires no treatment.  Peyronie's disease: This is mild and doesn't require treatment.   Meds ordered this encounter  Medications   sildenafil (VIAGRA) 100 MG  tablet    Sig: Take 1 tablet (100 mg total) by mouth daily as needed for erectile dysfunction.    Dispense:  10 tablet    Refill:  11     Orders Placed This Encounter  Procedures   Urinalysis, Routine w reflex microscopic     Return in about 1 year (around 07/05/2024).    CC: Dr. Guerry Bruin.     Bjorn Pippin 07/07/2023

## 2023-07-11 ENCOUNTER — Telehealth: Payer: Self-pay

## 2023-07-11 NOTE — Telephone Encounter (Signed)
I called [employer] at [phone], I left a voicemail requesting PSA labs with medical records along with our fax and call back number.  Will forward results once faxed over.

## 2023-07-11 NOTE — Telephone Encounter (Signed)
-----   Message from Bjorn Pippin sent at 07/06/2023  9:08 AM EDT ----- I need to see if Dr. Wylene Simmer has done a PSA in the past couple of years.  He is with [facility] in [place].

## 2024-02-09 ENCOUNTER — Ambulatory Visit: Attending: Orthopedic Surgery

## 2024-02-09 DIAGNOSIS — M25522 Pain in left elbow: Secondary | ICD-10-CM | POA: Insufficient documentation

## 2024-02-09 DIAGNOSIS — M62838 Other muscle spasm: Secondary | ICD-10-CM | POA: Diagnosis present

## 2024-02-09 DIAGNOSIS — M542 Cervicalgia: Secondary | ICD-10-CM | POA: Diagnosis present

## 2024-02-09 NOTE — Therapy (Signed)
 OUTPATIENT PHYSICAL THERAPY SHOULDER EVALUATION   Patient Name: Ryan Jennings MRN: [id number] DOB:07-11-1961, 63 y.o., male Today's Date: 02/09/2024  END OF SESSION:  PT End of Session - 02/09/24 0846     Visit Number 1    Number of Visits 12    Date for PT Re-Evaluation 05/03/24    PT Start Time 0848    PT Stop Time 0929    PT Time Calculation (min) 41 min    Activity Tolerance Patient tolerated treatment well    Behavior During Therapy [facility] for tasks assessed/performed             Past Medical History:  Diagnosis Date   Adenomatous colon polyp 2010   Esophageal ring    2010, 2012   GERD (gastroesophageal reflux disease)    Hyperlipidemia    Nephrolithiasis    PONV (postoperative nausea and vomiting)    PONV (postoperative nausea and vomiting)    Traumatic rupture of right distal biceps tendon    Past Surgical History:  Procedure Laterality Date   APPENDECTOMY  8/11   BICEPS TENDON REPAIR     bilateral   COLONOSCOPY  04/2009   sigmoid adenomatous polyp, next colonoscopy 04/2014   COLONOSCOPY N/A 03/24/2014   Procedure: COLONOSCOPY;  Surgeon: Suzette Espy, multiple rectal and colonic polyps, abnormal cecum (query Goody powder effect) s/p biopsy.  Tubular adenomas on pathology with benign colonic mucosal biopsy with inflammation likely secondary to NSAIDs.  Recommend repeat colonoscopy in 5 years.   COLONOSCOPY N/A 08/05/2020   Procedure: COLONOSCOPY;  Surgeon: Suzette Espy, MD;  Location: [facility];  Service: Endoscopy;  Laterality: N/A;  8:15am   DISTAL BICEPS TENDON REPAIR Right 06/03/2015   Procedure: RIGHT DISTAL BICEPS TENDON REPAIR;  Surgeon: Elly Habermann, MD;  Location: [facility];  Service: Orthopedics;  Laterality: Right;   ESOPHAGOGASTRODUODENOSCOPY  04/2009   distal erosive reflux esophagitis, schatzki ring s/p [name], hh, couple of antral erosions   ESOPHAGOGASTRODUODENOSCOPY  05/2011   Dr. Riley Cheadle: erosive reflux esophagitis, superimposed  on Schatzki's ring and early stricture s/p 54 F, small hiatal hernia   HERNIA REPAIR     x 3   POLYPECTOMY  08/05/2020   Procedure: POLYPECTOMY;  Surgeon: Suzette Espy, MD;  Location: [facility];  Service: Endoscopy;;   ROTATOR CUFF REPAIR     right   Patient Active Problem List   Diagnosis Date Noted   History of adenomatous polyp of colon 05/01/2020   Traumatic rupture of right distal biceps tendon    PONV (postoperative nausea and vomiting)    Adenomatous colon polyp 03/17/2014   Esophageal dysphagia 05/06/2011   GERD (gastroesophageal reflux disease) 05/06/2011   REFERRING PROVIDER: Saundra Curl, MD  REFERRING DIAG: left lateral epicondylitis   THERAPY DIAG:  Pain in left elbow  Rationale for Evaluation and Treatment: Rehabilitation  ONSET DATE: 7-8 weeks ago (April 2025)  SUBJECTIVE:  SUBJECTIVE STATEMENT: Patient reports that his left elbow has has been bothering him for about 7-8 weeks after helping lift an antique car body. He has tried injections, medications, and a compression band, but these have not seemed to make a difference. His pain does not seem to go away. His pain can get so bad that it will wake him up at night. However, this does not happen every night.  Hand dominance: Right  PERTINENT HISTORY: Unremarkable  PAIN:  Are you having pain? Yes: NPRS scale: Current: "a little" Worst: 7-8/10 Pain location: left elbow and forearm Pain description: constant dull pain, but can be sharp for brief periods Aggravating factors: gets worse as the day progresses, lifting, carrying  Relieving factors: heat (a little)   PRECAUTIONS: None  RED FLAGS: None   WEIGHT BEARING RESTRICTIONS: No  FALLS:  Has patient fallen in last 6 months? No  LIVING ENVIRONMENT: Lives with:  lives with their family Lives in: House/apartment Has following equipment at home: None  OCCUPATION: Retired  PLOF: Independent  PATIENT GOALS: reduced pain, improved function lifting, play golf, and be able to do his car restoration  NEXT MD VISIT: unsure at this time  OBJECTIVE:  Note: Objective measures were completed at Evaluation unless otherwise noted.  PATIENT SURVEYS:  Quick Dash 43.18  COGNITION: Overall cognitive status: Within functional limits for tasks assessed     SENSATION: Patient reports no numbness or tingling  POSTURE: Forward head  UPPER EXTREMITY ROM:   Active ROM Right eval Left eval  Shoulder flexion 147 149  Shoulder extension    Shoulder abduction 146 153  Shoulder adduction    Shoulder internal rotation To T11 To T7; pulling and ache in elbow  Shoulder external rotation To T2 T4  Elbow flexion 133 143  Elbow extension 0 1  Wrist flexion    Wrist extension    Wrist ulnar deviation    Wrist radial deviation    Wrist pronation [facility]  WFL   Wrist supination WFL  WFL   (Blank rows = not tested)  UPPER EXTREMITY MMT:  MMT Right eval Left eval  Shoulder flexion 4+/5 4+/5  Shoulder extension    Shoulder abduction 4+/5 4+/5  Shoulder adduction    Shoulder internal rotation 5/5 4+/5; slight pain  Shoulder external rotation 4+/5 5/5  Middle trapezius    Lower trapezius    Elbow flexion 5/5 5/5  Elbow extension 5/5 5/5  Wrist flexion 5/5 4/5  Wrist extension 4+/5 4-/5; familiar pain  Wrist ulnar deviation    Wrist radial deviation    Wrist pronation    Wrist supination    Grip strength (lbs) 85 55; familiar pain  (Blank rows = not tested)  JOINT MOBILITY TESTING:  Left radioulnar: WFL and nonpainful  Left humeroradial: WFL and nonpainful   PALPATION:  Familiar pain reproduced with palpation to left wrist extensors and discomfort with palpation to left olecranon and triceps tendon  TREATMENT DATE:                                    02/09/24 EXERCISE LOG  Exercise Repetitions and Resistance Comments  Wrist extensor stretch  3 x 30 seconds                     Blank cell = exercise not performed today   PATIENT EDUCATION: Education details: Plan of care, prognosis, objective findings, activity modification, healing, anatomy, and goals for physical therapy Person educated: Patient Education method: Explanation Education comprehension: verbalized understanding  HOME EXERCISE PROGRAM:   ASSESSMENT:  CLINICAL IMPRESSION: Patient is a 63 y.o. male who was seen today for physical therapy evaluation and treatment for acute left elbow pain.  He presented with low to moderate pain severity and irritability with left wrist extensor manual muscle testing and grip strength testing reproducing his familiar symptoms.  Recommend that he continue with skilled physical therapy to address his impairments to return to his prior level of function.  OBJECTIVE IMPAIRMENTS: decreased activity tolerance, decreased strength, impaired tone, impaired UE functional use, and pain.   ACTIVITY LIMITATIONS: carrying, lifting, and caring for others  PARTICIPATION LIMITATIONS: cleaning, community activity, and yard work  PERSONAL FACTORS: No significant personal factors are affecting patient's functional outcome.   REHAB POTENTIAL: Good  CLINICAL DECISION MAKING: Stable/uncomplicated  EVALUATION COMPLEXITY: Low   GOALS: Goals reviewed with patient? Yes  SHORT TERM GOALS: Target date: 03/01/24  Patient will be independent with his initial HEP. Baseline: Goal status: INITIAL  2.  Patient will be able to complete his daily activities without his familiar symptoms exceeding 5/10. Baseline:  Goal status: INITIAL  3.  Patient will improve his QuickDASH score to 30 or less for improved perceived function with  his daily activities. Baseline:  Goal status: INITIAL  LONG TERM GOALS: Target date: 03/22/24  Patient will be independent with his advanced HEP. Baseline:  Goal status: INITIAL  2.  Patient will be able to complete his daily activities without his familiar symptoms exceeding 3/10. Baseline:  Goal status: INITIAL  3.  Patient will be able to pick up and carry his grandchild without being limited by his familiar symptoms. Baseline:  Goal status: INITIAL  4.  Patient will improve his left hand grip strength to at least 75 pounds for improved function grasping objects. Baseline:  Goal status: INITIAL  5.  Patient will improve his QuickDASH score to 20 or less for improved perceived function with his daily activities. Baseline:  Goal status: INITIAL  PLAN:  PT FREQUENCY: 2x/week  PT DURATION: 6 weeks  PLANNED INTERVENTIONS: 97164- PT Re-evaluation, 97750- Physical Performance Testing, 97110-Therapeutic exercises, 97530- Therapeutic activity, [id number]- Neuromuscular re-education, 97535- Self Care, 16109- Manual therapy, G0283- Electrical stimulation (unattended), 97016- Vasopneumatic device, [id number]- Ultrasound, 60454 (1-2 muscles), 20561 (3+ muscles)- Dry Needling, Patient/Family education, Taping, Joint mobilization, Cryotherapy, and Moist heat  PLAN FOR NEXT SESSION: UBE, manual therapy, isometrics, wrist extensor stretch, and modalities as needed   Lane Pinon, PT 02/09/2024, 1:35 PM

## 2024-02-12 ENCOUNTER — Ambulatory Visit: Admitting: *Deleted

## 2024-02-12 ENCOUNTER — Encounter: Payer: Self-pay | Admitting: *Deleted

## 2024-02-12 DIAGNOSIS — M25522 Pain in left elbow: Secondary | ICD-10-CM

## 2024-02-12 DIAGNOSIS — M62838 Other muscle spasm: Secondary | ICD-10-CM

## 2024-02-12 DIAGNOSIS — M542 Cervicalgia: Secondary | ICD-10-CM

## 2024-02-12 NOTE — Therapy (Signed)
 OUTPATIENT PHYSICAL THERAPY SHOULDER TREATMENT   Patient Name: Ryan Jennings MRN: [id number] DOB:01/16/1961, 63 y.o., male Today's Date: 02/12/2024  END OF SESSION:  PT End of Session - 02/12/24 0811     Visit Number 2    Number of Visits 12    Date for PT Re-Evaluation 05/03/24    PT Start Time 0800    PT Stop Time 0849    PT Time Calculation (min) 49 min             Past Medical History:  Diagnosis Date   Adenomatous colon polyp 2010   Esophageal ring    2010, 2012   GERD (gastroesophageal reflux disease)    Hyperlipidemia    Nephrolithiasis    PONV (postoperative nausea and vomiting)    PONV (postoperative nausea and vomiting)    Traumatic rupture of right distal biceps tendon    Past Surgical History:  Procedure Laterality Date   APPENDECTOMY  8/11   BICEPS TENDON REPAIR     bilateral   COLONOSCOPY  04/2009   sigmoid adenomatous polyp, next colonoscopy 04/2014   COLONOSCOPY N/A 03/24/2014   Procedure: COLONOSCOPY;  Surgeon: Suzette Espy, multiple rectal and colonic polyps, abnormal cecum (query Goody powder effect) s/p biopsy.  Tubular adenomas on pathology with benign colonic mucosal biopsy with inflammation likely secondary to NSAIDs.  Recommend repeat colonoscopy in 5 years.   COLONOSCOPY N/A 08/05/2020   Procedure: COLONOSCOPY;  Surgeon: Suzette Espy, MD;  [facility]: [facility];  Service: Endoscopy;  Laterality: N/A;  8:15am   DISTAL BICEPS TENDON REPAIR Right 06/03/2015   Procedure: RIGHT DISTAL BICEPS TENDON REPAIR;  Surgeon: Elly Habermann, MD;  Location: [facility];  Service: Orthopedics;  Laterality: Right;   ESOPHAGOGASTRODUODENOSCOPY  04/2009   distal erosive reflux esophagitis, schatzki ring s/p 55F, hh, couple of antral erosions   ESOPHAGOGASTRODUODENOSCOPY  05/2011   Dr. Riley Cheadle: erosive reflux esophagitis, superimposed on Schatzki's ring and early stricture s/p 54 F, small hiatal hernia   HERNIA REPAIR     x 3   POLYPECTOMY   08/05/2020   Procedure: POLYPECTOMY;  Surgeon: Suzette Espy, MD;  Location: [facility];  Service: Endoscopy;;   ROTATOR CUFF REPAIR     right   Patient Active Problem List   Diagnosis Date Noted   History of adenomatous polyp of colon 05/01/2020   Traumatic rupture of right distal biceps tendon    PONV (postoperative nausea and vomiting)    Adenomatous colon polyp 03/17/2014   Esophageal dysphagia 05/06/2011   GERD (gastroesophageal reflux disease) 05/06/2011   REFERRING PROVIDER: Saundra Curl, MD  REFERRING DIAG: left lateral epicondylitis   THERAPY DIAG:  Pain in left elbow  Cervicalgia  Other muscle spasm  Rationale for Evaluation and Treatment: Rehabilitation  ONSET DATE: 7-8 weeks ago (April 2025)  SUBJECTIVE:  SUBJECTIVE STATEMENT: Patient reports that his left elbow has has been bothering him for about 7-8 weeks after helping lift an antique car body. The soreness is also above my elbow.     dominance: Right  PERTINENT HISTORY: Unremarkable  PAIN:  Are you having pain? Yes: NPRS scale: Current: "a little" Worst: 7-8/10 Pain location: left elbow and forearm Pain description: constant dull pain, but can be sharp for brief periods Aggravating factors: gets worse as the day progresses, lifting, carrying  Relieving factors: heat (a little)   PRECAUTIONS: None  RED FLAGS: None   WEIGHT BEARING RESTRICTIONS: No  FALLS:  Has patient fallen in last 6 months? No  LIVING ENVIRONMENT: Lives with: lives with their family Lives in: House/apartment Has following equipment at home: None  OCCUPATION: Retired  PLOF: Independent  PATIENT GOALS: reduced pain, improved function lifting, play golf, and be able to do his car restoration  NEXT MD VISIT: unsure at this  time  OBJECTIVE:  Note: Objective measures were completed at Evaluation unless otherwise noted.  PATIENT SURVEYS:  Quick Dash 43.18  COGNITION: Overall cognitive status: Within functional limits for tasks assessed     SENSATION: Patient reports no numbness or tingling  POSTURE: Forward head  UPPER EXTREMITY ROM:   Active ROM Right eval Left eval  Shoulder flexion 147 149  Shoulder extension    Shoulder abduction 146 153  Shoulder adduction    Shoulder internal rotation To T11 To T7; pulling and ache in elbow  Shoulder external rotation To T2 T4  Elbow flexion 133 143  Elbow extension 0 1  Wrist flexion    Wrist extension    Wrist ulnar deviation    Wrist radial deviation    Wrist pronation [facility]  WFL   Wrist supination WFL  WFL   (Blank rows = not tested)  UPPER EXTREMITY MMT:  MMT Right eval Left eval  Shoulder flexion 4+/5 4+/5  Shoulder extension    Shoulder abduction 4+/5 4+/5  Shoulder adduction    Shoulder internal rotation 5/5 4+/5; slight pain  Shoulder external rotation 4+/5 5/5  Middle trapezius    Lower trapezius    Elbow flexion 5/5 5/5  Elbow extension 5/5 5/5  Wrist flexion 5/5 4/5  Wrist extension 4+/5 4-/5; familiar pain  Wrist ulnar deviation    Wrist radial deviation    Wrist pronation    Wrist supination    Grip strength (lbs) 85 55; familiar pain  (Blank rows = not tested)  JOINT MOBILITY TESTING:  Left radioulnar: WFL and nonpainful  Left humeroradial: WFL and nonpainful   PALPATION:  Familiar pain reproduced with palpation to left wrist extensors and discomfort with palpation to left olecranon and triceps tendon                                                                                                                              TREATMENT DATE:  02/12/24 EXERCISE LOG  Exercise Repetitions and Resistance Comments  Wrist extensor stretch  3 x 30 seconds    UBE X8 mins   Red bar Grip  up/down 3x 10 each            Blank cell = exercise not performed today  US  combo x 8 mins 1.5 w/cm2 to  LT forearm   Manual STW/ IASTM to LT forearm musculature ICE massage  to LT forearm  PATIENT EDUCATION: Education details: Plan of care, prognosis, objective findings, activity modification, healing, anatomy, and goals for physical therapy Person educated: Patient Education method: Explanation Education comprehension: verbalized understanding  HOME EXERCISE PROGRAM:   ASSESSMENT:  CLINICAL IMPRESSION: Patient  arrived today dong fair with LT forearm. Rx focused on light exs, US  combo as well as manual STW and ice massage. Pt did well with Rx and is going to perform TENS at home if he can find it as well as ice massage.   OBJECTIVE IMPAIRMENTS: decreased activity tolerance, decreased strength, impaired tone, impaired UE functional use, and pain.   ACTIVITY LIMITATIONS: carrying, lifting, and caring for others  PARTICIPATION LIMITATIONS: cleaning, community activity, and yard work  PERSONAL FACTORS: No significant personal factors are affecting patient's functional outcome.   REHAB POTENTIAL: Good  CLINICAL DECISION MAKING: Stable/uncomplicated  EVALUATION COMPLEXITY: Low   GOALS: Goals reviewed with patient? Yes  SHORT TERM GOALS: Target date: 03/01/24  Patient will be independent with his initial HEP. Baseline: Goal status: INITIAL  2.  Patient will be able to complete his daily activities without his familiar symptoms exceeding 5/10. Baseline:  Goal status: INITIAL  3.  Patient will improve his QuickDASH score to 30 or less for improved perceived function with his daily activities. Baseline:  Goal status: INITIAL  LONG TERM GOALS: Target date: 03/22/24  Patient will be independent with his advanced HEP. Baseline:  Goal status: INITIAL  2.  Patient will be able to complete his daily activities without his familiar symptoms exceeding 3/10. Baseline:  Goal  status: INITIAL  3.  Patient will be able to pick up and carry his grandchild without being limited by his familiar symptoms. Baseline:  Goal status: INITIAL  4.  Patient will improve his left hand grip strength to at least 75 pounds for improved function grasping objects. Baseline:  Goal status: INITIAL  5.  Patient will improve his QuickDASH score to 20 or less for improved perceived function with his daily activities. Baseline:  Goal status: INITIAL  PLAN:  PT FREQUENCY: 2x/week  PT DURATION: 6 weeks  PLANNED INTERVENTIONS: 97164- PT Re-evaluation, 97750- Physical Performance Testing, 97110-Therapeutic exercises, 97530- Therapeutic activity, [id number]- Neuromuscular re-education, 97535- Self Care, 16109- Manual therapy, G0283- Electrical stimulation (unattended), 97016- Vasopneumatic device, [id number]- Ultrasound, 60454 (1-2 muscles), 20561 (3+ muscles)- Dry Needling, Patient/Family education, Taping, Joint mobilization, Cryotherapy, and Moist heat  PLAN FOR NEXT SESSION: UBE, manual therapy, isometrics, wrist extensor stretch, and modalities as needed   Zayra Devito,CHRIS, PTA 02/12/2024, 9:09 AM

## 2024-02-16 ENCOUNTER — Ambulatory Visit

## 2024-02-16 DIAGNOSIS — M25522 Pain in left elbow: Secondary | ICD-10-CM

## 2024-02-16 NOTE — Therapy (Signed)
 OUTPATIENT PHYSICAL THERAPY SHOULDER TREATMENT   Patient Name: Ryan Jennings MRN: [id number] DOB:[birth year], 63 y.o., male Today's Date: 02/16/2024  END OF SESSION:  PT End of Session - 02/16/24 0808     Visit Number 3    Number of Visits 12    Date for PT Re-Evaluation 05/03/24    PT Start Time 0800    PT Stop Time 0853    PT Time Calculation (min) 53 min          Past Medical History:  Diagnosis Date   Adenomatous colon polyp 2010   Esophageal ring    2010, 2012   GERD (gastroesophageal reflux disease)    Hyperlipidemia    Nephrolithiasis    PONV (postoperative nausea and vomiting)    PONV (postoperative nausea and vomiting)    Traumatic rupture of right distal biceps tendon    Past Surgical History:  Procedure Laterality Date   APPENDECTOMY  8/11   BICEPS TENDON REPAIR     bilateral   COLONOSCOPY  04/2009   sigmoid adenomatous polyp, next colonoscopy 04/2014   COLONOSCOPY N/A 03/24/2014   Procedure: COLONOSCOPY;  Surgeon: Suzette Espy, multiple rectal and colonic polyps, abnormal cecum (query Goody powder effect) s/p biopsy.  Tubular adenomas on pathology with benign colonic mucosal biopsy with inflammation likely secondary to NSAIDs.  Recommend repeat colonoscopy in 5 years.   COLONOSCOPY N/A 08/05/2020   Procedure: COLONOSCOPY;  Surgeon: Suzette Espy, MD;  Location: [facility];  Service: Endoscopy;  Laterality: N/A;  8:15am   DISTAL BICEPS TENDON REPAIR Right 06/03/2015   Procedure: RIGHT DISTAL BICEPS TENDON REPAIR;  Surgeon: Elly Habermann, MD;  Location: [facility];  Service: Orthopedics;  Laterality: Right;   ESOPHAGOGASTRODUODENOSCOPY  04/2009   distal erosive reflux esophagitis, schatzki ring s/p 37F, hh, couple of antral erosions   ESOPHAGOGASTRODUODENOSCOPY  05/2011   Dr. Riley Cheadle: erosive reflux esophagitis, superimposed on Schatzki's ring and early stricture s/p 54 F, small hiatal hernia   HERNIA REPAIR     x 3   POLYPECTOMY  08/05/2020    Procedure: POLYPECTOMY;  Surgeon: Suzette Espy, MD;  Location: [facility];  Service: Endoscopy;;   ROTATOR CUFF REPAIR     right   Patient Active Problem List   Diagnosis Date Noted   History of adenomatous polyp of colon 05/01/2020   Traumatic rupture of right distal biceps tendon    PONV (postoperative nausea and vomiting)    Adenomatous colon polyp 03/17/2014   Esophageal dysphagia 05/06/2011   GERD (gastroesophageal reflux disease) 05/06/2011   REFERRING PROVIDER: Saundra Curl, MD  REFERRING DIAG: left lateral epicondylitis   THERAPY DIAG:  Pain in left elbow  Rationale for Evaluation and Treatment: Rehabilitation  ONSET DATE: 7-8 weeks ago (April 2025)  SUBJECTIVE:  SUBJECTIVE STATEMENT: Pt reports 2-3/10 left forearm pain today.  Pt reports performing estim and ice massage at home.   dominance: Right  PERTINENT HISTORY: Unremarkable  PAIN:  Are you having pain? Yes: NPRS scale: Current: 2-3/10 Worst: 7-8/10 Pain location: left elbow and forearm Pain description: constant dull pain, but can be sharp for brief periods Aggravating factors: gets worse as the day progresses, lifting, carrying  Relieving factors: heat (a little)   PRECAUTIONS: None  RED FLAGS: None   WEIGHT BEARING RESTRICTIONS: No  FALLS:  Has patient fallen in last 6 months? No  LIVING ENVIRONMENT: Lives with: lives with their family Lives in: House/apartment Has following equipment at home: None  OCCUPATION: Retired  PLOF: Independent  PATIENT GOALS: reduced pain, improved function lifting, play golf, and be able to do his car restoration  NEXT MD VISIT: unsure at this time  OBJECTIVE:  Note: Objective measures were completed at Evaluation unless otherwise noted.  PATIENT SURVEYS:  Quick  Dash 43.18  COGNITION: Overall cognitive status: Within functional limits for tasks assessed     SENSATION: Patient reports no numbness or tingling  POSTURE: Forward head  UPPER EXTREMITY ROM:   Active ROM Right eval Left eval  Shoulder flexion 147 149  Shoulder extension    Shoulder abduction 146 153  Shoulder adduction    Shoulder internal rotation To T11 To T7; pulling and ache in elbow  Shoulder external rotation To T2 T4  Elbow flexion 133 143  Elbow extension 0 1  Wrist flexion    Wrist extension    Wrist ulnar deviation    Wrist radial deviation    Wrist pronation [facility]  WFL   Wrist supination WFL  WFL   (Blank rows = not tested)  UPPER EXTREMITY MMT:  MMT Right eval Left eval  Shoulder flexion 4+/5 4+/5  Shoulder extension    Shoulder abduction 4+/5 4+/5  Shoulder adduction    Shoulder internal rotation 5/5 4+/5; slight pain  Shoulder external rotation 4+/5 5/5  Middle trapezius    Lower trapezius    Elbow flexion 5/5 5/5  Elbow extension 5/5 5/5  Wrist flexion 5/5 4/5  Wrist extension 4+/5 4-/5; familiar pain  Wrist ulnar deviation    Wrist radial deviation    Wrist pronation    Wrist supination    Grip strength (lbs) 85 55; familiar pain  (Blank rows = not tested)  JOINT MOBILITY TESTING:  Left radioulnar: WFL and nonpainful  Left humeroradial: WFL and nonpainful   PALPATION:  Familiar pain reproduced with palpation to left wrist extensors and discomfort with palpation to left olecranon and triceps tendon                                                                                                                              TREATMENT DATE:   02/12/24    EXERCISE LOG  Exercise Repetitions and Resistance Comments  Hammer Sup/Pro 20 reps  Wrist Ext/Flex 1# x 20 reps each   Wrist extensor stretch     UBE X10 mins   Therabar Red Grip up/down 3x 10 each   Therabar Twists x 20 reps each Red   Ball Squeeze 20 reps x 3 sec hold    Blank  cell = exercise not performed today   Manual Therapy Soft Tissue Mobilization: Left forearm, STW/M to left forearm musculature to decrease pain and tone   Modalities  Date:  Combo: Left forearm, 1.5 w/cm2; 100%, 10 mins, Pain and Tone    PATIENT EDUCATION: Education details: Plan of care, prognosis, objective findings, activity modification, healing, anatomy, and goals for physical therapy Person educated: Patient Education method: Explanation Education comprehension: verbalized understanding  HOME EXERCISE PROGRAM:   ASSESSMENT:  CLINICAL IMPRESSION: Pt arrives for today's treatment session reporting 2-3/10 left forearm and elbow pain.  Pt able to tolerate increased time on UBE today with minimal discomfort.  Pt introduced to seated wrist and elbow exercises today with min cues for proper technique and hold time.  STW/M performed to left forearm musculature to decrease pain and tone.  Normal responses to all modalities performed today.  Pt reported decreased pain at completion of today's treatment session.   OBJECTIVE IMPAIRMENTS: decreased activity tolerance, decreased strength, impaired tone, impaired UE functional use, and pain.   ACTIVITY LIMITATIONS: carrying, lifting, and caring for others  PARTICIPATION LIMITATIONS: cleaning, community activity, and yard work  PERSONAL FACTORS: No significant personal factors are affecting patient's functional outcome.   REHAB POTENTIAL: Good  CLINICAL DECISION MAKING: Stable/uncomplicated  EVALUATION COMPLEXITY: Low   GOALS: Goals reviewed with patient? Yes  SHORT TERM GOALS: Target date: 03/01/24  Patient will be independent with his initial HEP. Baseline: Goal status: INITIAL  2.  Patient will be able to complete his daily activities without his familiar symptoms exceeding 5/10. Baseline:  Goal status: INITIAL  3.  Patient will improve his QuickDASH score to 30 or less for improved perceived function with his daily  activities. Baseline:  Goal status: INITIAL  LONG TERM GOALS: Target date: 03/22/24  Patient will be independent with his advanced HEP. Baseline:  Goal status: INITIAL  2.  Patient will be able to complete his daily activities without his familiar symptoms exceeding 3/10. Baseline:  Goal status: INITIAL  3.  Patient will be able to pick up and carry his grandchild without being limited by his familiar symptoms. Baseline:  Goal status: INITIAL  4.  Patient will improve his left hand grip strength to at least 75 pounds for improved function grasping objects. Baseline:  Goal status: INITIAL  5.  Patient will improve his QuickDASH score to 20 or less for improved perceived function with his daily activities. Baseline:  Goal status: INITIAL  PLAN:  PT FREQUENCY: 2x/week  PT DURATION: 6 weeks  PLANNED INTERVENTIONS: 97164- PT Re-evaluation, 97750- Physical Performance Testing, 97110-Therapeutic exercises, 97530- Therapeutic activity, [id number]- Neuromuscular re-education, 97535- Self Care, 09811- Manual therapy, G0283- Electrical stimulation (unattended), 97016- Vasopneumatic device, [id number]- Ultrasound, 91478 (1-2 muscles), 20561 (3+ muscles)- Dry Needling, Patient/Family education, Taping, Joint mobilization, Cryotherapy, and Moist heat  PLAN FOR NEXT SESSION: UBE, manual therapy, isometrics, wrist extensor stretch, and modalities as needed   Deryl Flora, PTA 02/16/2024, 9:05 AM

## 2024-02-20 ENCOUNTER — Encounter: Payer: Self-pay | Admitting: *Deleted

## 2024-02-20 ENCOUNTER — Ambulatory Visit: Admitting: *Deleted

## 2024-02-20 DIAGNOSIS — M25522 Pain in left elbow: Secondary | ICD-10-CM

## 2024-02-20 NOTE — Therapy (Signed)
 OUTPATIENT PHYSICAL THERAPY SHOULDER TREATMENT   Patient Name: Ryan Jennings MRN: [id number] DOB:July 15, 1961, 63 y.o., male Today's Date: 02/20/2024  END OF SESSION:  PT End of Session - 02/20/24 0906     Visit Number 4    Number of Visits 12    Date for PT Re-Evaluation 05/03/24    PT Start Time 0900    PT Stop Time 0948    PT Time Calculation (min) 48 min          Past Medical History:  Diagnosis Date   Adenomatous colon polyp 2010   Esophageal ring    2010, 2012   GERD (gastroesophageal reflux disease)    Hyperlipidemia    Nephrolithiasis    PONV (postoperative nausea and vomiting)    PONV (postoperative nausea and vomiting)    Traumatic rupture of right distal biceps tendon    Past Surgical History:  Procedure Laterality Date   APPENDECTOMY  8/11   BICEPS TENDON REPAIR     bilateral   COLONOSCOPY  04/2009   sigmoid adenomatous polyp, next colonoscopy 04/2014   COLONOSCOPY N/A 03/24/2014   Procedure: COLONOSCOPY;  Surgeon: Suzette Espy, multiple rectal and colonic polyps, abnormal cecum (query Goody powder effect) s/p biopsy.  Tubular adenomas on pathology with benign colonic mucosal biopsy with inflammation likely secondary to NSAIDs.  Recommend repeat colonoscopy in 5 years.   COLONOSCOPY N/A 08/05/2020   Procedure: COLONOSCOPY;  Surgeon: Suzette Espy, MD;  Location: [facility];  Service: Endoscopy;  Laterality: N/A;  8:15am   DISTAL BICEPS TENDON REPAIR Right 06/03/2015   Procedure: RIGHT DISTAL BICEPS TENDON REPAIR;  Surgeon: Elly Habermann, MD;  Location: [facility];  Service: Orthopedics;  Laterality: Right;   ESOPHAGOGASTRODUODENOSCOPY  04/2009   distal erosive reflux esophagitis, schatzki ring s/p 48F, hh, couple of antral erosions   ESOPHAGOGASTRODUODENOSCOPY  05/2011   Dr. Riley Cheadle: erosive reflux esophagitis, superimposed on Schatzki's ring and early stricture s/p 54 F, small hiatal hernia   HERNIA REPAIR     x 3   POLYPECTOMY  08/05/2020    Procedure: POLYPECTOMY;  Surgeon: Suzette Espy, MD;  Location: [facility];  Service: Endoscopy;;   ROTATOR CUFF REPAIR     right   Patient Active Problem List   Diagnosis Date Noted   History of adenomatous polyp of colon 05/01/2020   Traumatic rupture of right distal biceps tendon    PONV (postoperative nausea and vomiting)    Adenomatous colon polyp 03/17/2014   Esophageal dysphagia 05/06/2011   GERD (gastroesophageal reflux disease) 05/06/2011   REFERRING PROVIDER: Saundra Curl, MD  REFERRING DIAG: left lateral epicondylitis   THERAPY DIAG:  Pain in left elbow  Rationale for Evaluation and Treatment: Rehabilitation  ONSET DATE: 7-8 weeks ago (April 2025)  SUBJECTIVE:  SUBJECTIVE STATEMENT: Pt reports 2-3/10 left forearm soreness/ pain today.  Pt reports performing estim and ice massage at home each day and overall getting better   dominance: Right  PERTINENT HISTORY: Unremarkable  PAIN:  Are you having pain? Yes: NPRS scale: Current: 2-3/10 Worst: 7-8/10 Pain location: left elbow and forearm Pain description: constant dull pain, but can be sharp for brief periods Aggravating factors: gets worse as the day progresses, lifting, carrying  Relieving factors: heat (a little)   PRECAUTIONS: None  RED FLAGS: None   WEIGHT BEARING RESTRICTIONS: No  FALLS:  Has patient fallen in last 6 months? No  LIVING ENVIRONMENT: Lives with: lives with their family Lives in: House/apartment Has following equipment at home: None  OCCUPATION: Retired  PLOF: Independent  PATIENT GOALS: reduced pain, improved function lifting, play golf, and be able to do his car restoration  NEXT MD VISIT: unsure at this time  OBJECTIVE:  Note: Objective measures were completed at Evaluation unless  otherwise noted.  PATIENT SURVEYS:  Quick Dash 43.18  COGNITION: Overall cognitive status: Within functional limits for tasks assessed     SENSATION: Patient reports no numbness or tingling  POSTURE: Forward head  UPPER EXTREMITY ROM:   Active ROM Right eval Left eval  Shoulder flexion 147 149  Shoulder extension    Shoulder abduction 146 153  Shoulder adduction    Shoulder internal rotation To T11 To T7; pulling and ache in elbow  Shoulder external rotation To T2 T4  Elbow flexion 133 143  Elbow extension 0 1  Wrist flexion    Wrist extension    Wrist ulnar deviation    Wrist radial deviation    Wrist pronation [facility]  WFL   Wrist supination WFL  WFL   (Blank rows = not tested)  UPPER EXTREMITY MMT:  MMT Right eval Left eval  Shoulder flexion 4+/5 4+/5  Shoulder extension    Shoulder abduction 4+/5 4+/5  Shoulder adduction    Shoulder internal rotation 5/5 4+/5; slight pain  Shoulder external rotation 4+/5 5/5  Middle trapezius    Lower trapezius    Elbow flexion 5/5 5/5  Elbow extension 5/5 5/5  Wrist flexion 5/5 4/5  Wrist extension 4+/5 4-/5; familiar pain  Wrist ulnar deviation    Wrist radial deviation    Wrist pronation    Wrist supination    Grip strength (lbs) 85 55; familiar pain  (Blank rows = not tested)  JOINT MOBILITY TESTING:  Left radioulnar: WFL and nonpainful  Left humeroradial: WFL and nonpainful   PALPATION:  Familiar pain reproduced with palpation to left wrist extensors and discomfort with palpation to left olecranon and triceps tendon                                                                                                                              TREATMENT DATE:   02/20/24    EXERCISE LOG LT elbow  Exercise Repetitions and  Resistance Comments  Hammer Sup/Pro 20 reps    Wrist Ext/Flex 1# x 20 reps each   Wrist extensor stretch     UBE X10 mins  120 RPMs   Therabar Red Grip up/down 3x 10 each   Therabar Twists x  20 reps each Red   Ball Squeeze     Blank cell = exercise not performed today   Manual Therapy Soft Tissue Mobilization: Left forearm, STW/M to left forearm musculature to decrease pain and tone   Modalities  Date:  Combo: Left forearm, 3.44mhz 1.5 w/cm2; 100%, 10 mins, Pain and Tone    PATIENT EDUCATION: Education details: Plan of care, prognosis, objective findings, activity modification, healing, anatomy, and goals for physical therapy Person educated: Patient Education method: Explanation Education comprehension: verbalized understanding  HOME EXERCISE PROGRAM:   ASSESSMENT:  CLINICAL IMPRESSION: Pt arrives for today's treatment session reporting 2-3/10 left forearm and elbow pain and doing better over all.  Rx focused on light strengthening for LT forearm musculature, US  combo as well as STW to LT forearm.  STW/M performed to left forearm musculature to decrease pain and tone was tolerated fairly well with less soreness today.  Pt reported decreased pain at completion of today's treatment session.   OBJECTIVE IMPAIRMENTS: decreased activity tolerance, decreased strength, impaired tone, impaired UE functional use, and pain.   ACTIVITY LIMITATIONS: carrying, lifting, and caring for others  PARTICIPATION LIMITATIONS: cleaning, community activity, and yard work  PERSONAL FACTORS: No significant personal factors are affecting patient's functional outcome.   REHAB POTENTIAL: Good  CLINICAL DECISION MAKING: Stable/uncomplicated  EVALUATION COMPLEXITY: Low   GOALS: Goals reviewed with patient? Yes  SHORT TERM GOALS: Target date: 03/01/24  Patient will be independent with his initial HEP. Baseline: Goal status: MET  2.  Patient will be able to complete his daily activities without his familiar symptoms exceeding 5/10. Baseline:  Goal status: INITIAL  3.  Patient will improve his QuickDASH score to 30 or less for improved perceived function with his daily  activities. Baseline:  Goal status: INITIAL  LONG TERM GOALS: Target date: 03/22/24  Patient will be independent with his advanced HEP. Baseline:  Goal status: INITIAL  2.  Patient will be able to complete his daily activities without his familiar symptoms exceeding 3/10. Baseline:  Goal status: INITIAL  3.  Patient will be able to pick up and carry his grandchild without being limited by his familiar symptoms. Baseline:  Goal status: INITIAL  4.  Patient will improve his left hand grip strength to at least 75 pounds for improved function grasping objects. Baseline:  Goal status: INITIAL  5.  Patient will improve his QuickDASH score to 20 or less for improved perceived function with his daily activities. Baseline:  Goal status: INITIAL  PLAN:  PT FREQUENCY: 2x/week  PT DURATION: 6 weeks  PLANNED INTERVENTIONS: 97164- PT Re-evaluation, 97750- Physical Performance Testing, 97110-Therapeutic exercises, 97530- Therapeutic activity, [id number]- Neuromuscular re-education, 97535- Self Care, 09811- Manual therapy, G0283- Electrical stimulation (unattended), 97016- Vasopneumatic device, [id number]- Ultrasound, 91478 (1-2 muscles), 20561 (3+ muscles)- Dry Needling, Patient/Family education, Taping, Joint mobilization, Cryotherapy, and Moist heat  PLAN FOR NEXT SESSION: UBE, manual therapy, isometrics, wrist extensor stretch, and modalities as needed   Cori Henningsen,CHRIS, PTA 02/20/2024, 9:55 AM

## 2024-02-23 ENCOUNTER — Ambulatory Visit: Admitting: Physical Therapy

## 2024-02-23 DIAGNOSIS — M25522 Pain in left elbow: Secondary | ICD-10-CM

## 2024-02-23 NOTE — Therapy (Signed)
 OUTPATIENT PHYSICAL THERAPY SHOULDER TREATMENT   Patient Name: Ryan Jennings MRN: [id number] DOB:Feb 06, 1961, 63 y.o., male Today's Date: 02/23/2024  END OF SESSION:  PT End of Session - 02/23/24 0934     Visit Number 5    Number of Visits 12    Date for PT Re-Evaluation 05/03/24    PT Start Time 0847    PT Stop Time 0935    PT Time Calculation (min) 48 min    Activity Tolerance Patient tolerated treatment well    Behavior During Therapy [facility] for tasks assessed/performed          Past Medical History:  Diagnosis Date   Adenomatous colon polyp 2010   Esophageal ring    2010, 2012   GERD (gastroesophageal reflux disease)    Hyperlipidemia    Nephrolithiasis    PONV (postoperative nausea and vomiting)    PONV (postoperative nausea and vomiting)    Traumatic rupture of right distal biceps tendon    Past Surgical History:  Procedure Laterality Date   APPENDECTOMY  8/11   BICEPS TENDON REPAIR     bilateral   COLONOSCOPY  04/2009   sigmoid adenomatous polyp, next colonoscopy 04/2014   COLONOSCOPY N/A 03/24/2014   Procedure: COLONOSCOPY;  Surgeon: Suzette Espy, multiple rectal and colonic polyps, abnormal cecum (query Goody powder effect) s/p biopsy.  Tubular adenomas on pathology with benign colonic mucosal biopsy with inflammation likely secondary to NSAIDs.  Recommend repeat colonoscopy in 5 years.   COLONOSCOPY N/A 08/05/2020   Procedure: COLONOSCOPY;  Surgeon: Suzette Espy, MD;  [facility];  Service: Endoscopy;  Laterality: N/A;  8:15am   DISTAL BICEPS TENDON REPAIR Right 06/03/2015   Procedure: RIGHT DISTAL BICEPS TENDON REPAIR;  Surgeon: Elly Habermann, MD;  Location: [facility];  Service: Orthopedics;  Laterality: Right;   ESOPHAGOGASTRODUODENOSCOPY  04/2009   distal erosive reflux esophagitis, schatzki ring s/p 1F, hh, couple of antral erosions   ESOPHAGOGASTRODUODENOSCOPY  05/2011   Dr. Riley Cheadle: erosive reflux esophagitis, superimposed on  Schatzki's ring and early stricture s/p 54 F, small hiatal hernia   HERNIA REPAIR     x 3   POLYPECTOMY  08/05/2020   Procedure: POLYPECTOMY;  Surgeon: Suzette Espy, MD;  [facility];  Service: Endoscopy;;   ROTATOR CUFF REPAIR     right   Patient Active Problem List   Diagnosis Date Noted   History of adenomatous polyp of colon 05/01/2020   Traumatic rupture of right distal biceps tendon    PONV (postoperative nausea and vomiting)    Adenomatous colon polyp 03/17/2014   Esophageal dysphagia 05/06/2011   GERD (gastroesophageal reflux disease) 05/06/2011   REFERRING PROVIDER: Saundra Curl, MD  REFERRING DIAG: left lateral epicondylitis   THERAPY DIAG:  Pain in left elbow  Rationale for Evaluation and Treatment: Rehabilitation  ONSET DATE: 7-8 weeks ago (April 2025)  SUBJECTIVE:  SUBJECTIVE STATEMENT: About the same.  PERTINENT HISTORY: Unremarkable  PAIN:  Are you having pain? Yes: NPRS scale: Current: 2-3/10 Worst: 7-8/10 Pain location: left elbow and forearm Pain description: constant dull pain, but can be sharp for brief periods Aggravating factors: gets worse as the day progresses, lifting, carrying  Relieving factors: heat (a little)   PRECAUTIONS: None  RED FLAGS: None   WEIGHT BEARING RESTRICTIONS: No  FALLS:  Has patient fallen in last 6 months? No  LIVING ENVIRONMENT: Lives with: lives with their family Lives in: House/apartment Has following equipment at home: None  OCCUPATION: Retired  PLOF: Independent  PATIENT GOALS: reduced pain, improved function lifting, play golf, and be able to do his car restoration  NEXT MD VISIT: unsure at this time  OBJECTIVE:  Note: Objective measures were completed at Evaluation unless otherwise noted.  PATIENT  SURVEYS:  Quick Dash 43.18  COGNITION: Overall cognitive status: Within functional limits for tasks assessed     SENSATION: Patient reports no numbness or tingling  POSTURE: Forward head  UPPER EXTREMITY ROM:   Active ROM Right eval Left eval  Shoulder flexion 147 149  Shoulder extension    Shoulder abduction 146 153  Shoulder adduction    Shoulder internal rotation To T11 To T7; pulling and ache in elbow  Shoulder external rotation To T2 T4  Elbow flexion 133 143  Elbow extension 0 1  Wrist flexion    Wrist extension    Wrist ulnar deviation    Wrist radial deviation    Wrist pronation [facility]  WFL   Wrist supination WFL  WFL   (Blank rows = not tested)  UPPER EXTREMITY MMT:  MMT Right eval Left eval  Shoulder flexion 4+/5 4+/5  Shoulder extension    Shoulder abduction 4+/5 4+/5  Shoulder adduction    Shoulder internal rotation 5/5 4+/5; slight pain  Shoulder external rotation 4+/5 5/5  Middle trapezius    Lower trapezius    Elbow flexion 5/5 5/5  Elbow extension 5/5 5/5  Wrist flexion 5/5 4/5  Wrist extension 4+/5 4-/5; familiar pain  Wrist ulnar deviation    Wrist radial deviation    Wrist pronation    Wrist supination    Grip strength (lbs) 85 55; familiar pain  (Blank rows = not tested)  JOINT MOBILITY TESTING:  Left radioulnar: WFL and nonpainful  Left humeroradial: WFL and nonpainful   PALPATION:  Familiar pain reproduced with palpation to left wrist extensors and discomfort with palpation to left olecranon and triceps tendon                                                                                                                              TREATMENT DATE:   02/23/24:  UBE x 8 minutes f/b STW/M x 15 minutes with ischemic release technique utilized to patient left elbow extensor musculature f/b pre-mod e'stim (5 sec on and 5 sec off).  02/20/24  EXERCISE LOG LT elbow  Exercise Repetitions and Resistance Comments  Hammer Sup/Pro 20 reps     Wrist Ext/Flex 1# x 20 reps each   Wrist extensor stretch     UBE X10 mins  120 RPMs   Therabar Red Grip up/down 3x 10 each   Therabar Twists x 20 reps each Red   Ball Squeeze     Blank cell = exercise not performed today   Manual Therapy Soft Tissue Mobilization: Left forearm, STW/M to left forearm musculature to decrease pain and tone   Modalities  Date:  Combo: Left forearm, 3.30mhz 1.5 w/cm2; 100%, 10 mins, Pain and Tone    PATIENT EDUCATION: Education details: Plan of care, prognosis, objective findings, activity modification, healing, anatomy, and goals for physical therapy Person educated: Patient Education method: Explanation Education comprehension: verbalized understanding  HOME EXERCISE PROGRAM:   ASSESSMENT:  CLINICAL IMPRESSION: Pain reports pain come and goes.  He had a notable trigger point over his left elbow extensor musculature (proximal 1/3) with good response to STW/M.    OBJECTIVE IMPAIRMENTS: decreased activity tolerance, decreased strength, impaired tone, impaired UE functional use, and pain.   ACTIVITY LIMITATIONS: carrying, lifting, and caring for others  PARTICIPATION LIMITATIONS: cleaning, community activity, and yard work  PERSONAL FACTORS: No significant personal factors are affecting patient's functional outcome.   REHAB POTENTIAL: Good  CLINICAL DECISION MAKING: Stable/uncomplicated  EVALUATION COMPLEXITY: Low   GOALS: Goals reviewed with patient? Yes  SHORT TERM GOALS: Target date: 03/01/24  Patient will be independent with his initial HEP. Baseline: Goal status: MET  2.  Patient will be able to complete his daily activities without his familiar symptoms exceeding 5/10. Baseline:  Goal status: INITIAL  3.  Patient will improve his QuickDASH score to 30 or less for improved perceived function with his daily activities. Baseline:  Goal status: INITIAL  LONG TERM GOALS: Target date: 03/22/24  Patient will be independent  with his advanced HEP. Baseline:  Goal status: INITIAL  2.  Patient will be able to complete his daily activities without his familiar symptoms exceeding 3/10. Baseline:  Goal status: INITIAL  3.  Patient will be able to pick up and carry his grandchild without being limited by his familiar symptoms. Baseline:  Goal status: INITIAL  4.  Patient will improve his left hand grip strength to at least 75 pounds for improved function grasping objects. Baseline:  Goal status: INITIAL  5.  Patient will improve his QuickDASH score to 20 or less for improved perceived function with his daily activities. Baseline:  Goal status: INITIAL  PLAN:  PT FREQUENCY: 2x/week  PT DURATION: 6 weeks  PLANNED INTERVENTIONS: 97164- PT Re-evaluation, 97750- Physical Performance Testing, 97110-Therapeutic exercises, 97530- Therapeutic activity, [id number]- Neuromuscular re-education, 97535- Self Care, 40981- Manual therapy, G0283- Electrical stimulation (unattended), 97016- Vasopneumatic device, [id number]- Ultrasound, 19147 (1-2 muscles), 20561 (3+ muscles)- Dry Needling, Patient/Family education, Taping, Joint mobilization, Cryotherapy, and Moist heat  PLAN FOR NEXT SESSION: UBE, manual therapy, isometrics, wrist extensor stretch, and modalities as needed   Kayln Garceau, [place], PT 02/23/2024, 10:18 AM

## 2024-02-28 ENCOUNTER — Ambulatory Visit: Admitting: Physical Therapy

## 2024-02-28 DIAGNOSIS — M25522 Pain in left elbow: Secondary | ICD-10-CM

## 2024-02-28 NOTE — Therapy (Addendum)
 OUTPATIENT PHYSICAL THERAPY SHOULDER TREATMENT   Patient Name: Ryan Jennings MRN: [id number] DOB:06-16-1961, 63 y.o., male Today's Date: 02/28/2024  END OF SESSION:  PT End of Session - 02/28/24 1010     Visit Number 6    Number of Visits 12    Date for PT Re-Evaluation 05/03/24    PT Start Time 0846    PT Stop Time 0940    PT Time Calculation (min) 54 min    Activity Tolerance Patient tolerated treatment well          Past Medical History:  Diagnosis Date   Adenomatous colon polyp 2010   Esophageal ring    2010, 2012   GERD (gastroesophageal reflux disease)    Hyperlipidemia    Nephrolithiasis    PONV (postoperative nausea and vomiting)    PONV (postoperative nausea and vomiting)    Traumatic rupture of right distal biceps tendon    Past Surgical History:  Procedure Laterality Date   APPENDECTOMY  8/11   BICEPS TENDON REPAIR     bilateral   COLONOSCOPY  04/2009   sigmoid adenomatous polyp, next colonoscopy 04/2014   COLONOSCOPY N/A 03/24/2014   Procedure: COLONOSCOPY;  Surgeon: Lamar CHRISTELLA Hollingshead, multiple rectal and colonic polyps, abnormal cecum (query Goody powder effect) s/p biopsy.  Tubular adenomas on pathology with benign colonic mucosal biopsy with inflammation likely secondary to NSAIDs.  Recommend repeat colonoscopy in 5 years.   COLONOSCOPY N/A 08/05/2020   Procedure: COLONOSCOPY;  Surgeon: Hollingshead Lamar CHRISTELLA, MD;  [facility];  Service: Endoscopy;  Laterality: N/A;  8:15am   DISTAL BICEPS TENDON REPAIR Right 06/03/2015   Procedure: RIGHT DISTAL BICEPS TENDON REPAIR;  Surgeon: Lamar Millman, MD;  Location: [facility];  Service: Orthopedics;  Laterality: Right;   ESOPHAGOGASTRODUODENOSCOPY  04/2009   distal erosive reflux esophagitis, schatzki ring s/p 42F, hh, couple of antral erosions   ESOPHAGOGASTRODUODENOSCOPY  05/2011   Dr. Hollingshead: erosive reflux esophagitis, superimposed on Schatzki's ring and early stricture s/p 54 F, small hiatal  hernia   HERNIA REPAIR     x 3   POLYPECTOMY  08/05/2020   Procedure: POLYPECTOMY;  Surgeon: Hollingshead Lamar CHRISTELLA, MD;  [facility];  Service: Endoscopy;;   ROTATOR CUFF REPAIR     right   Patient Active Problem List   Diagnosis Date Noted   History of adenomatous polyp of colon 05/01/2020   Traumatic rupture of right distal biceps tendon    PONV (postoperative nausea and vomiting)    Adenomatous colon polyp 03/17/2014   Esophageal dysphagia 05/06/2011   GERD (gastroesophageal reflux disease) 05/06/2011   REFERRING PROVIDER: Beverley Evalene BIRCH, MD  REFERRING DIAG: left lateral epicondylitis   THERAPY DIAG:  Pain in left elbow  Rationale for Evaluation and Treatment: Rehabilitation  ONSET DATE: 7-8 weeks ago (April 2025)  SUBJECTIVE:  SUBJECTIVE STATEMENT: Did some weed eating yesterday.  Pain about a 4 right now.  PERTINENT HISTORY: Unremarkable  PAIN:  Are you having pain? Yes: NPRS scale: Current: 4/10 Worst: 7-8/10 Pain location: left elbow and forearm Pain description: constant dull pain, but can be sharp for brief periods Aggravating factors: gets worse as the day progresses, lifting, carrying  Relieving factors: heat (a little)   PRECAUTIONS: None  RED FLAGS: None   WEIGHT BEARING RESTRICTIONS: No  FALLS:  Has patient fallen in last 6 months? No  LIVING ENVIRONMENT: Lives with: lives with their family Lives in: House/apartment Has following equipment at home: None  OCCUPATION: Retired  PLOF: Independent  PATIENT GOALS: reduced pain, improved function lifting, play golf, and be able to do his car restoration  NEXT MD VISIT: unsure at this time  OBJECTIVE:  Note: Objective measures were completed at Evaluation unless otherwise noted.  PATIENT SURVEYS:  Quick  Dash 43.18  COGNITION: Overall cognitive status: Within functional limits for tasks assessed     SENSATION: Patient reports no numbness or tingling  POSTURE: Forward head  UPPER EXTREMITY ROM:   Active ROM Right eval Left eval  Shoulder flexion 147 149  Shoulder extension    Shoulder abduction 146 153  Shoulder adduction    Shoulder internal rotation To T11 To T7; pulling and ache in elbow  Shoulder external rotation To T2 T4  Elbow flexion 133 143  Elbow extension 0 1  Wrist flexion    Wrist extension    Wrist ulnar deviation    Wrist radial deviation    Wrist pronation [facility]  WFL   Wrist supination WFL  WFL   (Blank rows = not tested)  UPPER EXTREMITY MMT:  MMT Right eval Left eval  Shoulder flexion 4+/5 4+/5  Shoulder extension    Shoulder abduction 4+/5 4+/5  Shoulder adduction    Shoulder internal rotation 5/5 4+/5; slight pain  Shoulder external rotation 4+/5 5/5  Middle trapezius    Lower trapezius    Elbow flexion 5/5 5/5  Elbow extension 5/5 5/5  Wrist flexion 5/5 4/5  Wrist extension 4+/5 4-/5; familiar pain  Wrist ulnar deviation    Wrist radial deviation    Wrist pronation    Wrist supination    Grip strength (lbs) 85 55; familiar pain  (Blank rows = not tested)  JOINT MOBILITY TESTING:  Left radioulnar: WFL and nonpainful  Left humeroradial: WFL and nonpainful   PALPATION:  Familiar pain reproduced with palpation to left wrist extensors and discomfort with palpation to left olecranon and triceps tendon                                                                                                                              TREATMENT DATE:   02/28/24:  UBE x 10 minutes combo e'stim/US  at 1.50 W/CM2 x 7 minutes f/b STW/M x 10 minutes f/b pre-mod e'stim at 80-150 Hz (5 sec on  and 5 sec off).  Normal modality response following removal of modality.  02/23/24:  UBE x 8 minutes f/b STW/M x 15 minutes with ischemic release technique utilized to  patient left elbow extensor musculature f/b pre-mod e'stim (5 sec on and 5 sec off).  02/20/24    EXERCISE LOG LT elbow  Exercise Repetitions and Resistance Comments  Hammer Sup/Pro 20 reps    Wrist Ext/Flex 1# x 20 reps each   Wrist extensor stretch     UBE X10 mins  120 RPMs   Therabar Red Grip up/down 3x 10 each   Therabar Twists x 20 reps each Red   Ball Squeeze     Blank cell = exercise not performed today   Manual Therapy Soft Tissue Mobilization: Left forearm, STW/M to left forearm musculature to decrease pain and tone   Modalities  Date:  Combo: Left forearm, 3.24mhz 1.5 w/cm2; 100%, 10 mins, Pain and Tone    PATIENT EDUCATION: Education details: Plan of care, prognosis, objective findings, activity modification, healing, anatomy, and goals for physical therapy Person educated: Patient Education method: Explanation Education comprehension: verbalized understanding  HOME EXERCISE PROGRAM:   ASSESSMENT:  CLINICAL IMPRESSION: Patient with continued right elbow pain complaints.  Pain increased to day likely related to weed eating yesterday.    OBJECTIVE IMPAIRMENTS: decreased activity tolerance, decreased strength, impaired tone, impaired UE functional use, and pain.   ACTIVITY LIMITATIONS: carrying, lifting, and caring for others  PARTICIPATION LIMITATIONS: cleaning, community activity, and yard work  PERSONAL FACTORS: No significant personal factors are affecting patient's functional outcome.   REHAB POTENTIAL: Good  CLINICAL DECISION MAKING: Stable/uncomplicated  EVALUATION COMPLEXITY: Low   GOALS: Goals reviewed with patient? Yes  SHORT TERM GOALS: Target date: 03/01/24  Patient will be independent with his initial HEP. Baseline: Goal status: MET  2.  Patient will be able to complete his daily activities without his familiar symptoms exceeding 5/10. Baseline:  Goal status: INITIAL  3.  Patient will improve his QuickDASH score to 30 or less for  improved perceived function with his daily activities. Baseline:  Goal status: INITIAL  LONG TERM GOALS: Target date: 03/22/24  Patient will be independent with his advanced HEP. Baseline:  Goal status: INITIAL  2.  Patient will be able to complete his daily activities without his familiar symptoms exceeding 3/10. Baseline:  Goal status: INITIAL  3.  Patient will be able to pick up and carry his grandchild without being limited by his familiar symptoms. Baseline:  Goal status: INITIAL  4.  Patient will improve his left hand grip strength to at least 75 pounds for improved function grasping objects. Baseline:  Goal status: INITIAL  5.  Patient will improve his QuickDASH score to 20 or less for improved perceived function with his daily activities. Baseline:  Goal status: INITIAL  PLAN:  PT FREQUENCY: 2x/week  PT DURATION: 6 weeks  PLANNED INTERVENTIONS: 97164- PT Re-evaluation, 97750- Physical Performance Testing, 97110-Therapeutic exercises, 97530- Therapeutic activity, [id number]- Neuromuscular re-education, 97535- Self Care, 02859- Manual therapy, G0283- Electrical stimulation (unattended), 97016- Vasopneumatic device, [id number]- Ultrasound, 79439 (1-2 muscles), 20561 (3+ muscles)- Dry Needling, Patient/Family education, Taping, Joint mobilization, Cryotherapy, and Moist heat  PLAN FOR NEXT SESSION: UBE, manual therapy, isometrics, wrist extensor stretch, and modalities as needed   Najib Colmenares, [place], PT 02/28/2024, 10:14 AM

## 2024-03-01 ENCOUNTER — Ambulatory Visit: Admitting: Physical Therapy

## 2024-03-01 DIAGNOSIS — M25522 Pain in left elbow: Secondary | ICD-10-CM | POA: Diagnosis not present

## 2024-03-01 NOTE — Therapy (Signed)
 OUTPATIENT PHYSICAL THERAPY SHOULDER TREATMENT   Patient Name: Ryan Jennings MRN: [id number] DOB:Jan 30, 1961, 63 y.o., male Today's Date: 03/01/2024  END OF SESSION:  PT End of Session - 03/01/24 0850     Visit Number 7    Number of Visits 12    Date for PT Re-Evaluation 05/03/24    PT Start Time 0801    PT Stop Time 0900    PT Time Calculation (min) 59 min    Activity Tolerance Patient tolerated treatment well    Behavior During Therapy [facility] for tasks assessed/performed          Past Medical History:  Diagnosis Date   Adenomatous colon polyp 2010   Esophageal ring    2010, 2012   GERD (gastroesophageal reflux disease)    Hyperlipidemia    Nephrolithiasis    PONV (postoperative nausea and vomiting)    PONV (postoperative nausea and vomiting)    Traumatic rupture of right distal biceps tendon    Past Surgical History:  Procedure Laterality Date   APPENDECTOMY  8/11   BICEPS TENDON REPAIR     bilateral   COLONOSCOPY  04/2009   sigmoid adenomatous polyp, next colonoscopy 04/2014   COLONOSCOPY N/A 03/24/2014   Procedure: COLONOSCOPY;  Surgeon: Lamar CHRISTELLA Hollingshead, multiple rectal and colonic polyps, abnormal cecum (query Goody powder effect) s/p biopsy.  Tubular adenomas on pathology with benign colonic mucosal biopsy with inflammation likely secondary to NSAIDs.  Recommend repeat colonoscopy in 5 years.   COLONOSCOPY N/A 08/05/2020   Procedure: COLONOSCOPY;  Surgeon: Hollingshead Lamar CHRISTELLA, MD;  [facility];  Service: Endoscopy;  Laterality: N/A;  8:15am   DISTAL BICEPS TENDON REPAIR Right 06/03/2015   Procedure: RIGHT DISTAL BICEPS TENDON REPAIR;  Surgeon: Lamar Millman, MD;  Location: [facility];  Service: Orthopedics;  Laterality: Right;   ESOPHAGOGASTRODUODENOSCOPY  04/2009   distal erosive reflux esophagitis, schatzki ring s/p 49F, hh, couple of antral erosions   ESOPHAGOGASTRODUODENOSCOPY  05/2011   Dr. Hollingshead: erosive reflux esophagitis, superimposed on  Schatzki's ring and early stricture s/p 54 F, small hiatal hernia   HERNIA REPAIR     x 3   POLYPECTOMY  08/05/2020   Procedure: POLYPECTOMY;  Surgeon: Hollingshead Lamar CHRISTELLA, MD;  [facility];  Service: Endoscopy;;   ROTATOR CUFF REPAIR     right   Patient Active Problem List   Diagnosis Date Noted   History of adenomatous polyp of colon 05/01/2020   Traumatic rupture of right distal biceps tendon    PONV (postoperative nausea and vomiting)    Adenomatous colon polyp 03/17/2014   Esophageal dysphagia 05/06/2011   GERD (gastroesophageal reflux disease) 05/06/2011   REFERRING PROVIDER: Beverley Evalene BIRCH, MD  REFERRING DIAG: left lateral epicondylitis   THERAPY DIAG:  Pain in left elbow  Rationale for Evaluation and Treatment: Rehabilitation  ONSET DATE: 7-8 weeks ago (April 2025)  SUBJECTIVE:  SUBJECTIVE STATEMENT: More pain in Tricep today.   PERTINENT HISTORY: Unremarkable  PAIN:  Are you having pain? Yes: NPRS scale: Current: 4/10 Worst: 7-8/10 Pain location: left elbow and forearm Pain description: constant dull pain, but can be sharp for brief periods Aggravating factors: gets worse as the day progresses, lifting, carrying  Relieving factors: heat (a little)   PRECAUTIONS: None  RED FLAGS: None   WEIGHT BEARING RESTRICTIONS: No  FALLS:  Has patient fallen in last 6 months? No  LIVING ENVIRONMENT: Lives with: lives with their family Lives in: House/apartment Has following equipment at home: None  OCCUPATION: Retired  PLOF: Independent  PATIENT GOALS: reduced pain, improved function lifting, play golf, and be able to do his car restoration  NEXT MD VISIT: unsure at this time  OBJECTIVE:  Note: Objective measures were completed at Evaluation unless otherwise  noted.  PATIENT SURVEYS:  Quick Dash 43.18  COGNITION: Overall cognitive status: Within functional limits for tasks assessed     SENSATION: Patient reports no numbness or tingling  POSTURE: Forward head  UPPER EXTREMITY ROM:   Active ROM Right eval Left eval  Shoulder flexion 147 149  Shoulder extension    Shoulder abduction 146 153  Shoulder adduction    Shoulder internal rotation To T11 To T7; pulling and ache in elbow  Shoulder external rotation To T2 T4  Elbow flexion 133 143  Elbow extension 0 1  Wrist flexion    Wrist extension    Wrist ulnar deviation    Wrist radial deviation    Wrist pronation [facility]  WFL   Wrist supination WFL  WFL   (Blank rows = not tested)  UPPER EXTREMITY MMT:  MMT Right eval Left eval  Shoulder flexion 4+/5 4+/5  Shoulder extension    Shoulder abduction 4+/5 4+/5  Shoulder adduction    Shoulder internal rotation 5/5 4+/5; slight pain  Shoulder external rotation 4+/5 5/5  Middle trapezius    Lower trapezius    Elbow flexion 5/5 5/5  Elbow extension 5/5 5/5  Wrist flexion 5/5 4/5  Wrist extension 4+/5 4-/5; familiar pain  Wrist ulnar deviation    Wrist radial deviation    Wrist pronation    Wrist supination    Grip strength (lbs) 85 55; familiar pain  (Blank rows = not tested)  JOINT MOBILITY TESTING:  Left radioulnar: WFL and nonpainful  Left humeroradial: WFL and nonpainful   PALPATION:  Familiar pain reproduced with palpation to left wrist extensors and discomfort with palpation to left olecranon and triceps tendon                                                                                                                              TREATMENT DATE:   03/01/24:  UBE x 8 minutes combo e'stim/US  at 1.50 W/CM2 x 7 minutes to left affected tricep f/b STW/M x 15 minutes f/b IFC at 80-150 Hz on 40% scan. Normal modality response  following removal of modality.  02/28/24:  UBE x 10 minutes combo e'stim/US  at 1.50 W/CM2 x 7  minutes f/b STW/M x 10 minutes f/b pre-mod e'stim at 80-150 Hz (5 sec on and 5 sec off).  Normal modality response following removal of modality.  02/23/24:  UBE x 8 minutes f/b STW/M x 15 minutes with ischemic release technique utilized to patient left elbow extensor musculature f/b pre-mod e'stim (5 sec on and 5 sec off).  02/20/24    EXERCISE LOG LT elbow  Exercise Repetitions and Resistance Comments  Hammer Sup/Pro 20 reps    Wrist Ext/Flex 1# x 20 reps each   Wrist extensor stretch     UBE X10 mins  120 RPMs   Therabar Red Grip up/down 3x 10 each   Therabar Twists x 20 reps each Red   Ball Squeeze     Blank cell = exercise not performed today   Manual Therapy Soft Tissue Mobilization: Left forearm, STW/M to left forearm musculature to decrease pain and tone   Modalities  Date:  Combo: Left forearm, 3.82mhz 1.5 w/cm2; 100%, 10 mins, Pain and Tone    PATIENT EDUCATION: Education details: Plan of care, prognosis, objective findings, activity modification, healing, anatomy, and goals for physical therapy Person educated: Patient Education method: Explanation Education comprehension: verbalized understanding  HOME EXERCISE PROGRAM:   ASSESSMENT:  CLINICAL IMPRESSION: CC in left distal left Trceps region.  Tx focused on this area which patient tolerated without complaint.    OBJECTIVE IMPAIRMENTS: decreased activity tolerance, decreased strength, impaired tone, impaired UE functional use, and pain.   ACTIVITY LIMITATIONS: carrying, lifting, and caring for others  PARTICIPATION LIMITATIONS: cleaning, community activity, and yard work  PERSONAL FACTORS: No significant personal factors are affecting patient's functional outcome.   REHAB POTENTIAL: Good  CLINICAL DECISION MAKING: Stable/uncomplicated  EVALUATION COMPLEXITY: Low   GOALS: Goals reviewed with patient? Yes  SHORT TERM GOALS: Target date: 03/01/24  Patient will be independent with his initial  HEP. Baseline: Goal status: MET  2.  Patient will be able to complete his daily activities without his familiar symptoms exceeding 5/10. Baseline:  Goal status: INITIAL  3.  Patient will improve his QuickDASH score to 30 or less for improved perceived function with his daily activities. Baseline:  Goal status: INITIAL  LONG TERM GOALS: Target date: 03/22/24  Patient will be independent with his advanced HEP. Baseline:  Goal status: INITIAL  2.  Patient will be able to complete his daily activities without his familiar symptoms exceeding 3/10. Baseline:  Goal status: INITIAL  3.  Patient will be able to pick up and carry his grandchild without being limited by his familiar symptoms. Baseline:  Goal status: INITIAL  4.  Patient will improve his left hand grip strength to at least 75 pounds for improved function grasping objects. Baseline:  Goal status: INITIAL  5.  Patient will improve his QuickDASH score to 20 or less for improved perceived function with his daily activities. Baseline:  Goal status: INITIAL  PLAN:  PT FREQUENCY: 2x/week  PT DURATION: 6 weeks  PLANNED INTERVENTIONS: 97164- PT Re-evaluation, 97750- Physical Performance Testing, 97110-Therapeutic exercises, 97530- Therapeutic activity, [id number]- Neuromuscular re-education, 97535- Self Care, 02859- Manual therapy, G0283- Electrical stimulation (unattended), 97016- Vasopneumatic device, [id number]- Ultrasound, 79439 (1-2 muscles), 20561 (3+ muscles)- Dry Needling, Patient/Family education, Taping, Joint mobilization, Cryotherapy, and Moist heat  PLAN FOR NEXT SESSION: UBE, manual therapy, isometrics, wrist extensor stretch, and modalities as needed   Talton Delpriore, [place], PT 03/01/2024,  9:18 AM

## 2024-03-05 ENCOUNTER — Encounter: Admitting: Physical Therapy

## 2024-07-04 ENCOUNTER — Ambulatory Visit: Payer: 59 | Admitting: Urology

## 2024-07-10 DIAGNOSIS — K219 Gastro-esophageal reflux disease without esophagitis: Secondary | ICD-10-CM | POA: Diagnosis not present

## 2024-07-10 DIAGNOSIS — E7800 Pure hypercholesterolemia, unspecified: Secondary | ICD-10-CM | POA: Diagnosis not present

## 2024-07-10 DIAGNOSIS — N400 Enlarged prostate without lower urinary tract symptoms: Secondary | ICD-10-CM | POA: Diagnosis not present

## 2024-07-17 DIAGNOSIS — Z91018 Allergy to other foods: Secondary | ICD-10-CM | POA: Diagnosis not present

## 2024-07-17 DIAGNOSIS — R809 Proteinuria, unspecified: Secondary | ICD-10-CM | POA: Diagnosis not present

## 2024-07-17 DIAGNOSIS — G5601 Carpal tunnel syndrome, right upper limb: Secondary | ICD-10-CM | POA: Diagnosis not present

## 2024-07-17 DIAGNOSIS — Z0000 Encounter for general adult medical examination without abnormal findings: Secondary | ICD-10-CM | POA: Diagnosis not present

## 2024-07-17 DIAGNOSIS — M2669 Other specified disorders of temporomandibular joint: Secondary | ICD-10-CM | POA: Diagnosis not present

## 2024-07-17 DIAGNOSIS — I861 Scrotal varices: Secondary | ICD-10-CM | POA: Diagnosis not present

## 2024-07-17 DIAGNOSIS — N400 Enlarged prostate without lower urinary tract symptoms: Secondary | ICD-10-CM | POA: Diagnosis not present

## 2024-07-17 DIAGNOSIS — E7800 Pure hypercholesterolemia, unspecified: Secondary | ICD-10-CM | POA: Diagnosis not present

## 2024-07-17 DIAGNOSIS — K219 Gastro-esophageal reflux disease without esophagitis: Secondary | ICD-10-CM | POA: Diagnosis not present

## 2024-08-05 ENCOUNTER — Ambulatory Visit: Admitting: Urology

## 2024-08-05 ENCOUNTER — Encounter: Payer: Self-pay | Admitting: Urology

## 2024-08-05 VITALS — BP 139/52 | HR 73

## 2024-08-05 DIAGNOSIS — I861 Scrotal varices: Secondary | ICD-10-CM

## 2024-08-05 DIAGNOSIS — N5203 Combined arterial insufficiency and corporo-venous occlusive erectile dysfunction: Secondary | ICD-10-CM

## 2024-08-05 DIAGNOSIS — N486 Induration penis plastica: Secondary | ICD-10-CM | POA: Diagnosis not present

## 2024-08-05 DIAGNOSIS — N401 Enlarged prostate with lower urinary tract symptoms: Secondary | ICD-10-CM

## 2024-08-05 DIAGNOSIS — R350 Frequency of micturition: Secondary | ICD-10-CM

## 2024-08-05 DIAGNOSIS — N138 Other obstructive and reflux uropathy: Secondary | ICD-10-CM

## 2024-08-05 DIAGNOSIS — N529 Male erectile dysfunction, unspecified: Secondary | ICD-10-CM | POA: Diagnosis not present

## 2024-08-05 MED ORDER — TADALAFIL 20 MG PO TABS: 20.0000 mg | ORAL_TABLET | Freq: Every day | ORAL | 5 refills | Status: AC | PRN

## 2024-08-05 NOTE — Progress Notes (Unsigned)
 08/05/2024 3:07 PM   Ryan Jennings Fitting Oct 25, 1960 [id number]  Referring provider: Tisovec, Richard W, MD [address] [place],  [place] [address]  No chief complaint on file.   HPI:  New patient for me-  1) BPH-patient with an IPSS of 4 with some frequency and nocturia x 0-1.  A 2024 PSA was 0.3 and exam was benign.  2) right scrotal pain-noted since 2014. Right varicocele noted. Scrotal US  at [facility] benign and RENAL US  - no mass. Stable exam with a G2 right varicocele in 2024.  Worked with PT.  Mild and intermittent.  3) ED-takes sildenafil .  4) Peyronie's-mild.  Today, seen for the above.  IPSS 3. Occasional urgency and nocturia. UA clear. He has right varicocele. Uses Deluth boxer brief support. Takes prn NSAID.   He worked for [employer], then retired with [employer]. He mows a lot of grass.     PMH: Past Medical History:  Diagnosis Date   Adenomatous colon polyp 2010   Esophageal ring    2010, 2012   GERD (gastroesophageal reflux disease)    Hyperlipidemia    Nephrolithiasis    PONV (postoperative nausea and vomiting)    PONV (postoperative nausea and vomiting)    Traumatic rupture of right distal biceps tendon     Surgical History: Past Surgical History:  Procedure Laterality Date   APPENDECTOMY  8/11   BICEPS TENDON REPAIR     bilateral   COLONOSCOPY  04/2009   sigmoid adenomatous polyp, next colonoscopy 04/2014   COLONOSCOPY N/A 03/24/2014   Procedure: COLONOSCOPY;  Surgeon: Lamar CHRISTELLA Hollingshead, multiple rectal and colonic polyps, abnormal cecum (query Goody powder effect) s/p biopsy.  Tubular adenomas on pathology with benign colonic mucosal biopsy with inflammation likely secondary to NSAIDs.  Recommend repeat colonoscopy in 5 years.   COLONOSCOPY N/A 08/05/2020   Procedure: COLONOSCOPY;  Surgeon: Hollingshead Lamar CHRISTELLA, MD;  [facility];  Service: Endoscopy;  Laterality: N/A;  8:15am   DISTAL BICEPS TENDON REPAIR Right 06/03/2015   Procedure: RIGHT DISTAL  BICEPS TENDON REPAIR;  Surgeon: Lamar Millman, MD;  Location: [facility];  Service: Orthopedics;  Laterality: Right;   ESOPHAGOGASTRODUODENOSCOPY  04/2009   distal erosive reflux esophagitis, schatzki ring s/p 76F, hh, couple of antral erosions   ESOPHAGOGASTRODUODENOSCOPY  05/2011   Dr. Hollingshead: erosive reflux esophagitis, superimposed on Schatzki's ring and early stricture s/p 54 F, small hiatal hernia   HERNIA REPAIR     x 3   POLYPECTOMY  08/05/2020   Procedure: POLYPECTOMY;  Surgeon: Hollingshead Lamar CHRISTELLA, MD;  [facility];  Service: Endoscopy;;   ROTATOR CUFF REPAIR     right    Home Medications:  Allergies as of 08/05/2024       Reactions   Alpha-gal Anaphylaxis   Penicillins Rash   Reaction: Childhood        Medication List        Accurate as of August 05, 2024  3:07 PM. If you have any questions, ask your nurse or doctor.          ascorbic acid 500 MG tablet Commonly known as: VITAMIN C Take 500 mg by mouth 3 (three) times a week.   cetirizine  10 MG tablet Commonly known as: ZYRTEC  Take 1 tablet (10 mg total) by mouth daily.   Dexilant  60 MG capsule Generic drug: dexlansoprazole  TAKE ONE (1) CAPSULE EACH DAY What changed: See the new instructions.   sildenafil  100 MG tablet Commonly known  as: VIAGRA  Take 1 tablet (100 mg total) by mouth daily as needed for erectile dysfunction.        Allergies:  Allergies  Allergen Reactions   Alpha-Gal Anaphylaxis   Penicillins Rash    Reaction: Childhood    Family History: Family History  Problem Relation Age of Onset   Lymphoma Mother 87       deceased   Colon polyps Father        before age of 21   Cancer - Lung Brother    Pancreatic cancer Maternal Grandfather    Stroke Paternal Grandfather 47   Colon cancer Neg Hx     Social History:  reports that he has never smoked. He has never used smokeless tobacco. He reports that he does not drink alcohol and does not use  drugs.   Physical Exam: BP (!) 139/52   Pulse 73   Constitutional:  Alert and oriented, No acute distress. HEENT: [place] AT, moist mucus membranes.  Trachea midline, no masses. Cardiovascular: No clubbing, cyanosis, or edema. Respiratory: Normal respiratory effort, no increased work of breathing. GI: Abdomen is soft, nontender, nondistended, no abdominal masses GU: No CVA tenderness Skin: No rashes, bruises or suspicious lesions. Neurologic: Grossly intact, no focal deficits, moving all 4 extremities. Psychiatric: Normal mood and affect.  Laboratory Data: No results found for: WBC, HGB, HCT, MCV, PLT  No results found for: CREATININE  No results found for: PSA  No results found for: TESTOSTERONE  No results found for: HGBA1C  Urinalysis    Component Value Date/Time   APPEARANCEUR Clear 07/06/2023 0919   GLUCOSEU Negative 07/06/2023 0919   BILIRUBINUR Negative 07/06/2023 0919   PROTEINUR Negative 07/06/2023 0919   NITRITE Negative 07/06/2023 0919   LEUKOCYTESUR Negative 07/06/2023 0919    Lab Results  Component Value Date   LABMICR Comment 07/06/2023    Pertinent Imaging: N/AA  Assessment & Plan:    BPH-PSA very low last year.  Continues to follow with Dr. Tisovec.  IPSS low.  ED-stable. Discussed nature r/b/a to pde5i. He will try tadalafil .   Varicocele - stable. Discussed referral to Dr. Lovie and pt reports he and Dr. Watt considered it but he declined.   No follow-ups on file.  Donnice Brooks, MD  [facility]  [address] [place], [place] [address] [phone]

## 2024-08-06 LAB — URINALYSIS, ROUTINE W REFLEX MICROSCOPIC
Bilirubin, UA: NEGATIVE
Glucose, UA: NEGATIVE
Ketones, UA: NEGATIVE
Leukocytes,UA: NEGATIVE
Nitrite, UA: NEGATIVE
RBC, UA: NEGATIVE
Specific Gravity, UA: 1.025 (ref 1.005–1.030)
Urobilinogen, Ur: 1 mg/dL (ref 0.2–1.0)
pH, UA: 6 (ref 5.0–7.5)

## 2025-08-25 ENCOUNTER — Ambulatory Visit: Admitting: Urology
